# Patient Record
Sex: Male | Born: 1980 | Race: Black or African American | Hispanic: No | Marital: Single | Smoking: Current every day smoker
Health system: Southern US, Community
[De-identification: ages and names within clinical notes are randomized; demographics above are authoritative.]

## PROBLEM LIST (undated history)

## (undated) DIAGNOSIS — S31119A Laceration without foreign body of abdominal wall, unspecified quadrant without penetration into peritoneal cavity, initial encounter: Secondary | ICD-10-CM

---
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
## 2002-10-14 ENCOUNTER — Emergency Department (HOSPITAL_COMMUNITY): Admission: EM | Admit: 2002-10-14 | Discharge: 2002-10-14 | Payer: Self-pay | Admitting: Unknown Physician Specialty

## 2005-09-05 ENCOUNTER — Emergency Department (HOSPITAL_COMMUNITY): Admission: EM | Admit: 2005-09-05 | Discharge: 2005-09-05 | Payer: Self-pay | Admitting: Emergency Medicine

## 2005-09-10 ENCOUNTER — Emergency Department (HOSPITAL_COMMUNITY): Admission: EM | Admit: 2005-09-10 | Discharge: 2005-09-10 | Payer: Self-pay | Admitting: Family Medicine

## 2009-03-12 ENCOUNTER — Emergency Department (HOSPITAL_COMMUNITY): Admission: EM | Admit: 2009-03-12 | Discharge: 2009-03-12 | Payer: Self-pay | Admitting: Emergency Medicine

## 2009-04-04 ENCOUNTER — Emergency Department (HOSPITAL_COMMUNITY): Admission: EM | Admit: 2009-04-04 | Discharge: 2009-04-04 | Payer: Self-pay | Admitting: Emergency Medicine

## 2010-04-23 LAB — GC/CHLAMYDIA PROBE AMP, GENITAL
Chlamydia, DNA Probe: NEGATIVE
GC Probe Amp, Genital: NEGATIVE

## 2010-05-31 ENCOUNTER — Ambulatory Visit: Payer: Self-pay | Admitting: Family Medicine

## 2010-06-15 NOTE — Discharge Summary (Signed)
[place]. [facility]  Patient:    Luis Luna, Luis Luna Visit Number: [id number] MRN: [id number]          Service Type: MED Location: PEDS 6153 [id number] Attending Physician:  Rosanne Sack Dictated by:   Anastasio Auerbach, M.D. Admit Date:  09/25/2000 Discharge Date: 09/28/2000   CC:         Jamesetta Geralds, M.D.   Discharge Summary  DATE OF BIRTH:  [birth year]  DISCHARGE DIAGNOSES: 1. Severe exudative pharyngitis.    a. Trismus.    b. Intractable throat pain.    c. Fever. 2. Dehydration. 3. Right temporomandibular joint pain.  DISCHARGE MEDICATIONS: 1. (New) Azithromycin 250 mg 1 p.o. q.d. at 7 p.m. through September 30, 2000    (total five-day therapy). 2. (New) Clindamycin 300 mg 2 p.o. q.8h. through October 05, 2000 (total    10-day therapy). 3. (New) Ibuprofen 400 mg p.o. t.i.d. with meals for four days. 4. (New) Extra-strength Tylenol q.4h. p.r.n. for breakthrough pain.  * The patient is to take only the above medications.  CONDITION ON DISCHARGE:  Stable.  Afebrile x 36 hours.  No throat pain.  DISPOSITION:  Home with family.  RECOMMENDED ACTIVITY:  No strenuous activity until follow-up with Dr. Tery Sanfilippo.  RECOMMENDED DIET:  As tolerated.  SPECIAL INSTRUCTIONS:  The patient was told to contact Dr. Tery Sanfilippo or go to the walk-in clinic or the emergency room if his sore throat started to get worse or if he developed fevers or significant diarrhea.  FOLLOW-UP:  The patient is to contact Dr. Tery Sanfilippo at [facility] to schedule a follow-up appointment next week.  He should check his throat at that time and also recheck the right TMJ discomfort.  CONSULTATIONS:  None.  PROCEDURES:  None.  HOSPITAL COURSE: #1 - SEVERE EXUDATIVE PHARYNGITIS:  Luis Luna is an 30 year old African-American gentleman who presented with a five-day history of a progressive sore throat followed by fevers, chills, and difficulty swallowing. He was seen in the  emergency department one day prior to actually being admitted to the hospital.  At that time he had a negative Monospot, negative rapid strep, and a negative HIV.  They thought he had thrush and placed him on Diflucan and nystatin.  Overnight his symptoms got worse, and he presented again.  We admitted him to the hospital and started him on IV azithromycin and clindamycin.  His t-max went up to 101.2.  He proceed to defervesce and 36 hours prior to discharge was afebrile.  I opted to keep him on azithromycin for a total of five days of therapy and clindamycin for a total of 10 days of therapy.  At this point his pharynx no longer has exudates on it.  It is mildly erythematous but no longer painful at all.  He knows to return if his sore throat or fevers recur.  Because clindamycin can cause C. difficile colitis, I also told him to call or return if he develops significant diarrhea.  #2 - RIGHT TEMPOROMANDIBULAR JOINT PAIN:  Luis Luna does not have any significant adenopathy.  His uvula is midline.  I have no reason to suspect any type of abscess or extension of infection.  I do not palpate any clicking when he opens and closes his mouth, but that is when the pain occurs.  I will treat this conservatively with ibuprofen and have him follow up with Dr. Tery Sanfilippo in the clinic.  #3 - DEHYDRATION:  Secondary to the acute  infection as well as the inability to eat or drink anything, Luis Luna was mildly dehydrated on presentation. He received IV fluids while he was here.  DISCHARGE LABORATORY DATA:  Hemoglobin 13.2, WBC 7400 (down from 15,600). Blood cultures done ______ 29, 2002, remain negative at the time of discharge.  Rapid strep probe negative. Dictated by:   Anastasio Auerbach, M.D. Attending Physician:  Rosanne Sack DD:  09/28/00 TD:  09/28/00 Job: [id number]

## 2010-06-15 NOTE — H&P (Signed)
[place]. [facility]  Patient:    Luis Luna, Luis Luna Visit Number: [id number] MRN: [id number]          Service Type: EMS Location: [place] Attending Physician:  Ilene Qua Dictated by:   Rosanne Sack, M.D. Admit Date:  09/24/2000 Discharge Date: 09/24/2000   CC:         [facility]   History and Physical  DATE OF BIRTH: 1980-12-30  PROBLEM LIST:  1. Severe exudative pharyngitis with septicemia and trismus.  2. Intractable throat pain secondary to #1.  3. Dehydration.  CHIEF COMPLAINT: Fever, chills, intractable throat pain, unable to swallow.  HISTORY OF PRESENT ILLNESS: The patient is a very pleasant 30 year old African-American man, who presents with a five day history of progressive worsening of sore throat followed by fever, chills, and unable to tolerate his own secretions.  The patient was seen in the emergency department of [place] on September 24, 2000 and at that time he had a fever of 101.0 degrees with a leukocytosis of 13,000 and left shift.  The patient had a negative mono spot, a rapid strep throat, and a negative HIV ______.  It was thought that the patient had a Candida tonsillitis and esophagitis.  He was placed on Diflucan and Nystatin.  Overnight the patients symptoms have gotten worse.  He is not able to swallow liquids without experiencing excruciating pain.  For this reason he has been afraid of swallowing his own secretions. He denies shortness of breath.  He has had some productive cough in the last 24 hours that seems to be secretions from his throat and nasal cavity.  No hemoptysis, hematemesis, melena, tarry stools, bright red blood per rectum. No focal weakness, no syncope.  No urinary symptoms.  No abdominal symptoms. No nausea or vomiting.  No diarrhea or constipation.  The patient describes malaise.  No myalgias.  No arthralgias.  No skin rash.  No headache.   No blurred vision.  No orthopnea or PND.  No rhinorrhea.  The patient complains of intractable constant sore throat that gets worse with swallowing of his own secretions.  PAST MEDICAL HISTORY: As in HPI.  ALLERGIES: None.  MEDICATIONS:  1. Diflucan 100 mg p.o. q.d.  2. Nystatin q.i.d.  3. OxyContin.  The patient was not able to swallow the Diflucan nor the OxyContin due to intractable throat pain.  SOCIAL HISTORY: The patient is single, no children.  No history of IV drug abuse.  He is a International aid/development worker.  No tobacco use.  Occasional alcohol use. Occasional marijuana use.  The patient had unprotected sex with three different partners in the last few months.  FAMILY HISTORY: The patients grandmothers both had diabetes as well as hypertension.  One of the grandmothers had a stroke in her 45s.  The patients great-grandfather may have a heart attack but the patient is not sure.  There is no malignancy history in the family.  REVIEW OF SYSTEMS: As in HPI.  PHYSICAL EXAMINATION:  VITAL SIGNS: Temperature 100.9 degrees, blood pressure 134/60, heart rate 90, respiratory rate 18.  Oxygen saturation 98% on room air.  HEENT: Normocephalic, atraumatic.  Sclerae nonicteric.  Conjunctivae within normal limits.  PERRLA.  EOMI.  Funduscopic examination negative for papilledema or hemorrhages.  TMs within normal limits.  Dry mucous membranes. Oropharynx is consistent with pharyngeal erythema with some exudative plaque. There is no adenopathy.  There is no evidence of stridor.  NECK:  Supple.  No JVD, no bruits, no adenopathy, no thyromegaly.  LUNGS: Clear to auscultation bilaterally.  No crackles, no wheezing.  Good air movement bilaterally.  CARDIAC: Regular rate and rhythm without murmurs, rubs, or gallops.  Normal S1 and S2.  ABDOMEN: Nontender, nondistended.  Bowel sounds present.  No hepatosplenomegaly.  No rebound, guarding, masses, bruits, or guarding.  GU: Examination  within normal limits.  RECTAL: Empty vault, normal sphincter tone.  EXTREMITIES: No edema, no cyanosis.  Pulses 2+ bilaterally.  NEUROLOGIC: Alert and oriented x 3.  Strength 5/5 in all extremities.  DTRs 3/5 in all extremities.  Cranial nerves 2-12 intact.  Sensory intact.  Plantar reflexes downgoing bilaterally.  LABORATORY DATA: Pending.  ASSESSMENT/PLAN:  1. Severe exudative pharyngitis with septicemia and trismus.  As     described in the History of Present Illness section, the patient presents     with worsening symptoms after being treated for 24 hours for possible     Candida tonsillitis.  Physical examination is consistent with severe     exudative pharyngitis without evidence of abscess by clinical and physical     examination.  There is no evidence of stridor.  The patient is able to     swallow though he experiences excruciating pain.  There is no evidence of     human immunodeficiency virus or Candida infection.  The mono spot was     negative as well as the group A strep screening test.  The patient     continues to be febrile and has slight signs of septicemia.  For this     reason our plan is the following - We will obtain two blood cultures,     repeat a CBC and obtain a chest x-ray for completion.  Will start     intravenous antibiotic therapy.  The microorganisms will be covered for     the following - C. diphtheria, Mycoplasma, A. haemolyticus and anaerobic     flora, though the latter is less likely.  For this reason I chose     intravenous Zithromax and clindamycin.  Will support with IV fluids and     viscous lidocaine to help with the patients sore throat.  If the     patients clinical symptoms do not get better by tomorrow morning a CAT     scan of the neck will be obtained and ENT consultation also will be     considered.  2. Intractable throat pain.  As described in problem #1, viscous lidocaine     will be started.  In addition, intravenous morphine will  be used as      needed.  The patient is not able to tolerate tablets.  3. Dehydration.  The patient has clear signs of dehydration.  He has not     been eating well for the last five days.  Also, the patients recurrent     fever has contributed to the patients dehydration.  Will start IV fluids     and follow fluid balance as well as daily weights. Dictated by:   Rosanne Sack, M.D. Attending Physician:  Ilene Qua DD:  09/25/00 TD:  09/26/00 Job: [id number]

## 2010-06-15 NOTE — Discharge Summary (Signed)
[place]. [facility]  Patient:    Luis Luna, Luis Luna Visit Number: [id number] MRN: [id number]          Service Type: MED Location: PEDS 6153 01 Attending Physician:  Rosanne Sack Dictated by:   Anastasio Auerbach, M.D. Admit Date:  09/25/2000 Discharge Date: 09/28/2000   CC:         Jamesetta Geralds, M.D.   Discharge Summary  CONTINUATION  DATE OF BIRTH:  14-Jun-1980  HOSPITAL COURSE: #1 - SEVERE EXUDATIVE PHARYNGITIS:  Luis Luna is an 30 year old African-American gentleman who presented with a five-day history of a progressive sore throat followed by fevers, chills, and difficulty swallowing. He was seen in the emergency department one day prior to actually being admitted to the hospital.  At that time he had a negative Monospot, negative rapid strep, and a negative HIV.  They thought he had thrush and placed him on Diflucan and nystatin.  Overnight his symptoms got worse, and he presented again.  We admitted him to the hospital and started him on IV azithromycin and clindamycin.  His t-max went up to 101.2.  He proceed to defervesce and 36 hours prior to discharge was afebrile.  I opted to keep him on azithromycin for a total of five days of therapy and clindamycin for a total of 10 days of therapy.  At this point his pharynx no longer has exudates on it.  It is mildly erythematous but no longer painful at all.  He knows to return if his sore throat or fevers recur.  Because clindamycin can cause C. difficile colitis, I also told him to call or return if he develops significant diarrhea.  #2 - RIGHT TEMPOROMANDIBULAR JOINT PAIN:  Luis Luna does not have any significant adenopathy.  His uvula is midline.  I have no reason to suspect any type of abscess or extension of infection.  I do not palpate any clicking when he opens and closes his mouth, but that is when the pain occurs.  I will treat this conservatively with ibuprofen and have him follow up  with Dr. Tery Sanfilippo in the clinic.  #3 - DEHYDRATION:  Secondary to the acute infection as well as the inability to eat or drink anything, Luis Luna was mildly dehydrated on presentation. He received IV fluids while he was here.  DISCHARGE LABORATORY DATA:  Hemoglobin 13.2, WBC 7400 (down from 15,600). Blood cultures done ______ 29, 2002, remain negative at the time of discharge.  Rapid strep probe negative. Dictated by:   Anastasio Auerbach, M.D. Attending Physician:  Rosanne Sack DD:  09/28/00 TD:  09/28/00 Job: [id number]

## 2010-06-18 ENCOUNTER — Ambulatory Visit: Payer: Self-pay | Admitting: Family Medicine

## 2010-09-11 ENCOUNTER — Emergency Department (HOSPITAL_COMMUNITY): Payer: Self-pay

## 2010-09-11 ENCOUNTER — Emergency Department (HOSPITAL_COMMUNITY)
Admission: EM | Admit: 2010-09-11 | Discharge: 2010-09-11 | Disposition: A | Discharge status: Home / Self Care | Payer: Self-pay | Attending: Emergency Medicine | Admitting: Emergency Medicine

## 2010-09-11 DIAGNOSIS — M79609 Pain in unspecified limb: Secondary | ICD-10-CM | POA: Insufficient documentation

## 2010-09-11 DIAGNOSIS — S92919A Unspecified fracture of unspecified toe(s), initial encounter for closed fracture: Secondary | ICD-10-CM | POA: Insufficient documentation

## 2010-09-11 DIAGNOSIS — W208XXA Other cause of strike by thrown, projected or falling object, initial encounter: Secondary | ICD-10-CM | POA: Insufficient documentation

## 2010-12-18 ENCOUNTER — Emergency Department (HOSPITAL_COMMUNITY): Payer: Self-pay

## 2010-12-18 ENCOUNTER — Encounter: Payer: Self-pay | Admitting: *Deleted

## 2010-12-18 ENCOUNTER — Encounter (HOSPITAL_COMMUNITY): Payer: Self-pay

## 2010-12-18 ENCOUNTER — Encounter (HOSPITAL_COMMUNITY)
Admission: EM | Disposition: A | Discharge status: Other Acute Inpatient Hospital | Payer: Self-pay | Source: Home / Self Care

## 2010-12-18 ENCOUNTER — Inpatient Hospital Stay (HOSPITAL_COMMUNITY)
Admission: EM | Admit: 2010-12-18 | Discharge: 2010-12-25 | DRG: 982 | Payer: Self-pay | Attending: General Surgery | Admitting: General Surgery

## 2010-12-18 DIAGNOSIS — S41109A Unspecified open wound of unspecified upper arm, initial encounter: Secondary | ICD-10-CM

## 2010-12-18 DIAGNOSIS — S36119A Unspecified injury of liver, initial encounter: Secondary | ICD-10-CM

## 2010-12-18 DIAGNOSIS — S31119A Laceration without foreign body of abdominal wall, unspecified quadrant without penetration into peritoneal cavity, initial encounter: Secondary | ICD-10-CM | POA: Diagnosis present

## 2010-12-18 DIAGNOSIS — J982 Interstitial emphysema: Secondary | ICD-10-CM | POA: Diagnosis present

## 2010-12-18 DIAGNOSIS — T148XXA Other injury of unspecified body region, initial encounter: Secondary | ICD-10-CM

## 2010-12-18 DIAGNOSIS — D62 Acute posthemorrhagic anemia: Secondary | ICD-10-CM | POA: Diagnosis present

## 2010-12-18 DIAGNOSIS — K560 Paralytic ileus: Secondary | ICD-10-CM | POA: Diagnosis present

## 2010-12-18 DIAGNOSIS — IMO0002 Reserved for concepts with insufficient information to code with codable children: Secondary | ICD-10-CM | POA: Diagnosis present

## 2010-12-18 DIAGNOSIS — K668 Other specified disorders of peritoneum: Secondary | ICD-10-CM | POA: Diagnosis present

## 2010-12-18 DIAGNOSIS — S21109A Unspecified open wound of unspecified front wall of thorax without penetration into thoracic cavity, initial encounter: Secondary | ICD-10-CM

## 2010-12-18 DIAGNOSIS — S31109A Unspecified open wound of abdominal wall, unspecified quadrant without penetration into peritoneal cavity, initial encounter: Principal | ICD-10-CM | POA: Diagnosis present

## 2010-12-18 DIAGNOSIS — S25509A Unspecified injury of intercostal blood vessels, unspecified side, initial encounter: Secondary | ICD-10-CM | POA: Diagnosis present

## 2010-12-18 DIAGNOSIS — S51809A Unspecified open wound of unspecified forearm, initial encounter: Secondary | ICD-10-CM | POA: Diagnosis present

## 2010-12-18 DIAGNOSIS — S36116A Major laceration of liver, initial encounter: Secondary | ICD-10-CM | POA: Diagnosis present

## 2010-12-18 DIAGNOSIS — Z5900 Homelessness unspecified: Secondary | ICD-10-CM

## 2010-12-18 DIAGNOSIS — S20219A Contusion of unspecified front wall of thorax, initial encounter: Secondary | ICD-10-CM | POA: Diagnosis present

## 2010-12-18 DIAGNOSIS — Y998 Other external cause status: Secondary | ICD-10-CM

## 2010-12-18 DIAGNOSIS — F329 Major depressive disorder, single episode, unspecified: Secondary | ICD-10-CM | POA: Diagnosis present

## 2010-12-18 DIAGNOSIS — S90412A Abrasion, left great toe, initial encounter: Secondary | ICD-10-CM | POA: Diagnosis present

## 2010-12-18 DIAGNOSIS — M109 Gout, unspecified: Secondary | ICD-10-CM | POA: Diagnosis present

## 2010-12-18 DIAGNOSIS — S36113A Laceration of liver, unspecified degree, initial encounter: Secondary | ICD-10-CM | POA: Diagnosis present

## 2010-12-18 DIAGNOSIS — F3289 Other specified depressive episodes: Secondary | ICD-10-CM | POA: Diagnosis present

## 2010-12-18 DIAGNOSIS — S41112A Laceration without foreign body of left upper arm, initial encounter: Secondary | ICD-10-CM | POA: Diagnosis present

## 2010-12-18 HISTORY — PX: LAPAROTOMY: SHX154

## 2010-12-18 LAB — COMPREHENSIVE METABOLIC PANEL
AST: 26 U/L (ref 0–37)
Albumin: 4.5 g/dL (ref 3.5–5.2)
Calcium: 10.3 mg/dL (ref 8.4–10.5)
Creatinine, Ser: 1.37 mg/dL — ABNORMAL HIGH (ref 0.50–1.35)
Sodium: 138 mEq/L (ref 135–145)

## 2010-12-18 LAB — LACTIC ACID, PLASMA: Lactic Acid, Venous: 10.2 mmol/L — ABNORMAL HIGH (ref 0.5–2.2)

## 2010-12-18 LAB — POCT I-STAT, CHEM 8
BUN: 15 mg/dL (ref 6–23)
Chloride: 102 mEq/L (ref 96–112)
Creatinine, Ser: 1.4 mg/dL — ABNORMAL HIGH (ref 0.50–1.35)
Hemoglobin: 17.3 g/dL — ABNORMAL HIGH (ref 13.0–17.0)
Potassium: 3.6 mEq/L (ref 3.5–5.1)
Sodium: 139 mEq/L (ref 135–145)

## 2010-12-18 LAB — CBC
HCT: 46.7 % (ref 39.0–52.0)
Hemoglobin: 15.9 g/dL (ref 13.0–17.0)
MCH: 30.5 pg (ref 26.0–34.0)
MCV: 89.6 fL (ref 78.0–100.0)
RBC: 5.21 MIL/uL (ref 4.22–5.81)

## 2010-12-18 SURGERY — LAPAROTOMY, EXPLORATORY
Anesthesia: General | Wound class: Dirty or Infected

## 2010-12-18 MED ORDER — DIPHENHYDRAMINE HCL 50 MG/ML IJ SOLN
12.5000 mg | Freq: Four times a day (QID) | INTRAMUSCULAR | Status: DC | PRN
  Administered 2010-12-19 (×2): 12.5 mg via INTRAVENOUS
  Filled 2010-12-18 (×2): qty 1

## 2010-12-18 MED ORDER — ONDANSETRON HCL 4 MG/2ML IJ SOLN
INTRAMUSCULAR | Status: DC | PRN
  Administered 2010-12-18: 4 mg via INTRAVENOUS

## 2010-12-18 MED ORDER — CEFAZOLIN SODIUM 1-5 GM-% IV SOLN
INTRAVENOUS | Status: AC
  Administered 2010-12-18: 1 g via INTRAVENOUS
  Filled 2010-12-18: qty 50

## 2010-12-18 MED ORDER — CEFAZOLIN SODIUM 1 G IJ SOLR
INTRAMUSCULAR | Status: AC
  Administered 2010-12-18: 1 g via INTRAMUSCULAR
  Administered 2010-12-18: 20:00:00
  Filled 2010-12-18: qty 10

## 2010-12-18 MED ORDER — FENTANYL CITRATE 0.05 MG/ML IJ SOLN
INTRAMUSCULAR | Status: DC | PRN
  Administered 2010-12-18: 50 ug via INTRAVENOUS
  Administered 2010-12-18: 150 ug via INTRAVENOUS
  Administered 2010-12-18: 100 ug via INTRAVENOUS
  Administered 2010-12-18: 50 ug via INTRAVENOUS
  Administered 2010-12-18: 100 ug via INTRAVENOUS

## 2010-12-18 MED ORDER — FENTANYL CITRATE 0.05 MG/ML IJ SOLN
INTRAMUSCULAR | Status: DC | PRN
  Administered 2010-12-18: 20:00:00 via INTRAVENOUS
  Administered 2010-12-18: 50 ug via INTRAVENOUS
  Administered 2010-12-18: 20:00:00 via INTRAVENOUS

## 2010-12-18 MED ORDER — HYDRALAZINE HCL 20 MG/ML IJ SOLN
INTRAMUSCULAR | Status: DC | PRN
  Administered 2010-12-18: 5 mg via INTRAVENOUS

## 2010-12-18 MED ORDER — NEOSTIGMINE METHYLSULFATE 1 MG/ML IJ SOLN
INTRAMUSCULAR | Status: DC | PRN
  Administered 2010-12-18: 3.5 mg via INTRAVENOUS

## 2010-12-18 MED ORDER — DIPHENHYDRAMINE HCL 12.5 MG/5ML PO ELIX
12.5000 mg | ORAL_SOLUTION | Freq: Four times a day (QID) | ORAL | Status: DC | PRN
  Administered 2010-12-20: 12.5 mg via ORAL
  Filled 2010-12-18 (×3): qty 5

## 2010-12-18 MED ORDER — ONDANSETRON HCL 4 MG/2ML IJ SOLN: 4.0000 mg | Freq: Once | INTRAMUSCULAR | Status: DC | PRN

## 2010-12-18 MED ORDER — LACTATED RINGERS IV SOLN: INTRAVENOUS | Status: DC

## 2010-12-18 MED ORDER — HETASTARCH-ELECTROLYTES 6 % IV SOLN
INTRAVENOUS | Status: DC | PRN
  Administered 2010-12-18: 21:00:00 via INTRAVENOUS

## 2010-12-18 MED ORDER — PROPOFOL 10 MG/ML IV EMUL
INTRAVENOUS | Status: DC | PRN
  Administered 2010-12-18: 50 mg via INTRAVENOUS
  Administered 2010-12-18: 370 mg via INTRAVENOUS

## 2010-12-18 MED ORDER — HYDROMORPHONE HCL PF 1 MG/ML IJ SOLN
0.2500 mg | INTRAMUSCULAR | Status: DC | PRN
  Administered 2010-12-18 (×2): 0.5 mg via INTRAVENOUS

## 2010-12-18 MED ORDER — ONDANSETRON HCL 4 MG/2ML IJ SOLN
4.0000 mg | Freq: Four times a day (QID) | INTRAMUSCULAR | Status: DC | PRN
  Administered 2010-12-19: 4 mg via INTRAVENOUS
  Filled 2010-12-18: qty 2

## 2010-12-18 MED ORDER — SUCCINYLCHOLINE CHLORIDE 20 MG/ML IJ SOLN
INTRAMUSCULAR | Status: DC | PRN
  Administered 2010-12-18: 100 mg via INTRAVENOUS

## 2010-12-18 MED ORDER — NALOXONE HCL 0.4 MG/ML IJ SOLN: 0.4000 mg | INTRAMUSCULAR | Status: DC | PRN

## 2010-12-18 MED ORDER — MORPHINE SULFATE 2 MG/ML IJ SOLN: 0.0500 mg/kg | INTRAMUSCULAR | Status: DC | PRN

## 2010-12-18 MED ORDER — CEFAZOLIN SODIUM 1-5 GM-% IV SOLN
INTRAVENOUS | Status: AC
  Filled 2010-12-18: qty 100

## 2010-12-18 MED ORDER — IOHEXOL 300 MG/ML  SOLN
100.0000 mL | Freq: Once | INTRAMUSCULAR | Status: AC | PRN
  Administered 2010-12-18: 100 mL via INTRAVENOUS

## 2010-12-18 MED ORDER — MICROFIBRILLAR COLL HEMOSTAT EX PADS
MEDICATED_PAD | CUTANEOUS | Status: DC | PRN
  Administered 2010-12-18: 1 via TOPICAL

## 2010-12-18 MED ORDER — MEPERIDINE HCL 25 MG/ML IJ SOLN: 6.2500 mg | INTRAMUSCULAR | Status: DC | PRN

## 2010-12-18 MED ORDER — MIDAZOLAM HCL 5 MG/5ML IJ SOLN
INTRAMUSCULAR | Status: DC | PRN
  Administered 2010-12-18: 2 mg via INTRAVENOUS

## 2010-12-18 MED ORDER — MORPHINE SULFATE (PF) 1 MG/ML IV SOLN
INTRAVENOUS | Status: DC
  Administered 2010-12-18: 1 mg via INTRAVENOUS
  Administered 2010-12-19: 4.4 mg via INTRAVENOUS
  Administered 2010-12-19: 9 mg via INTRAVENOUS
  Administered 2010-12-19 (×2): via INTRAVENOUS
  Administered 2010-12-19: 18.44 mg via INTRAVENOUS
  Administered 2010-12-19: 22.19 mg via INTRAVENOUS
  Administered 2010-12-19: 10.5 mg via INTRAVENOUS
  Administered 2010-12-20: 12 mg via INTRAVENOUS
  Administered 2010-12-20: 12:00:00 via INTRAVENOUS
  Administered 2010-12-20: 4 mg via INTRAVENOUS
  Administered 2010-12-20: 11 mg via INTRAVENOUS
  Administered 2010-12-21: 3 mg via INTRAVENOUS
  Administered 2010-12-21 (×2): 2 mg via INTRAVENOUS
  Administered 2010-12-21: 6 mg via INTRAVENOUS
  Administered 2010-12-21: 3 mg via INTRAVENOUS
  Administered 2010-12-21: 4 mg via INTRAVENOUS
  Administered 2010-12-21 – 2010-12-22 (×2): 1 mg via INTRAVENOUS
  Administered 2010-12-22: 2 mg via INTRAVENOUS
  Administered 2010-12-22: 5 mg via INTRAVENOUS
  Administered 2010-12-22: 2 mg via INTRAVENOUS
  Administered 2010-12-23 (×2): 1 mg via INTRAVENOUS
  Filled 2010-12-18 (×6): qty 25

## 2010-12-18 MED ORDER — GLYCOPYRROLATE 0.2 MG/ML IJ SOLN
INTRAMUSCULAR | Status: DC | PRN
  Administered 2010-12-18: .5 mg via INTRAVENOUS

## 2010-12-18 MED ORDER — PANTOPRAZOLE SODIUM 40 MG IV SOLR
40.0000 mg | Freq: Every day | INTRAVENOUS | Status: DC
  Administered 2010-12-19: 40 mg via INTRAVENOUS
  Filled 2010-12-18 (×2): qty 40

## 2010-12-18 MED ORDER — ONDANSETRON HCL 4 MG PO TABS: 4.0000 mg | ORAL_TABLET | Freq: Four times a day (QID) | ORAL | Status: DC | PRN

## 2010-12-18 MED ORDER — POTASSIUM CHLORIDE IN NACL 20-0.9 MEQ/L-% IV SOLN
INTRAVENOUS | Status: DC
  Administered 2010-12-19 – 2010-12-24 (×7): via INTRAVENOUS
  Filled 2010-12-18 (×10): qty 1000

## 2010-12-18 MED ORDER — ROCURONIUM BROMIDE 100 MG/10ML IV SOLN
INTRAVENOUS | Status: DC | PRN
  Administered 2010-12-18: 30 mg via INTRAVENOUS
  Administered 2010-12-18: 50 mg via INTRAVENOUS

## 2010-12-18 MED ORDER — LACTATED RINGERS IV SOLN
INTRAVENOUS | Status: DC | PRN
  Administered 2010-12-18: 20:00:00 via INTRAVENOUS

## 2010-12-18 MED ORDER — SODIUM CHLORIDE 0.9 % IJ SOLN: 9.0000 mL | INTRAMUSCULAR | Status: DC | PRN

## 2010-12-18 MED ORDER — PANTOPRAZOLE SODIUM 40 MG PO TBEC
40.0000 mg | DELAYED_RELEASE_TABLET | Freq: Every day | ORAL | Status: DC
  Administered 2010-12-19 – 2010-12-23 (×5): 40 mg via ORAL
  Filled 2010-12-18 (×4): qty 1

## 2010-12-18 MED ORDER — BACITRACIN ZINC 500 UNIT/GM EX OINT
TOPICAL_OINTMENT | CUTANEOUS | Status: DC | PRN
  Administered 2010-12-18: 1 via TOPICAL

## 2010-12-18 SURGICAL SUPPLY — 55 items
BANDAGE ELASTIC 4 VELCRO ST LF (GAUZE/BANDAGES/DRESSINGS) ×1 IMPLANT
BLADE SURG ROTATE 9660 (MISCELLANEOUS) ×1 IMPLANT
CANISTER SUCTION 2500CC (MISCELLANEOUS) ×2 IMPLANT
CHLORAPREP W/TINT 26ML (MISCELLANEOUS) ×2 IMPLANT
CLOTH BEACON ORANGE TIMEOUT ST (SAFETY) ×2 IMPLANT
COVER SURGICAL LIGHT HANDLE (MISCELLANEOUS) ×2 IMPLANT
DRAPE LAPAROSCOPIC ABDOMINAL (DRAPES) ×2 IMPLANT
DRAPE UTILITY 15X26 W/TAPE STR (DRAPE) ×4 IMPLANT
DRAPE WARM FLUID 44X44 (DRAPE) ×2 IMPLANT
DRSG PAD ABDOMINAL 8X10 ST (GAUZE/BANDAGES/DRESSINGS) ×1 IMPLANT
ELECT BLADE 6.5 EXT (BLADE) IMPLANT
ELECT REM PT RETURN 9FT ADLT (ELECTROSURGICAL) ×2
ELECTRODE REM PT RTRN 9FT ADLT (ELECTROSURGICAL) ×1 IMPLANT
GAUZE XEROFORM 1X8 LF (GAUZE/BANDAGES/DRESSINGS) ×2 IMPLANT
GLOVE BIO SURGEON STRL SZ7.5 (GLOVE) ×1 IMPLANT
GLOVE BIO SURGEON STRL SZ8 (GLOVE) ×1 IMPLANT
GLOVE BIOGEL PI IND STRL 7.5 (GLOVE) IMPLANT
GLOVE BIOGEL PI IND STRL 8 (GLOVE) ×1 IMPLANT
GLOVE BIOGEL PI INDICATOR 7.5 (GLOVE) ×1
GLOVE BIOGEL PI INDICATOR 8 (GLOVE) ×1
GLOVE ECLIPSE 7.5 STRL STRAW (GLOVE) ×2 IMPLANT
GOWN STRL NON-REIN LRG LVL3 (GOWN DISPOSABLE) ×4 IMPLANT
GOWN STRL REIN XL XLG (GOWN DISPOSABLE) ×2 IMPLANT
HEMOSTAT SURGICEL .5X2 ABSORB (HEMOSTASIS) ×1 IMPLANT
HEMOSTAT SURGICEL 2X14 (HEMOSTASIS) ×1 IMPLANT
HEMOSTAT SURGICEL 2X4 FIBR (HEMOSTASIS) ×1 IMPLANT
HIBICLENS 32OZ XXX (MISCELLANEOUS) ×1 IMPLANT
KIT BASIN OR (CUSTOM PROCEDURE TRAY) ×2 IMPLANT
KIT ROOM TURNOVER OR (KITS) ×2 IMPLANT
LIGASURE IMPACT 36 18CM CVD LR (INSTRUMENTS) IMPLANT
NS IRRIG 1000ML POUR BTL (IV SOLUTION) ×7 IMPLANT
PACK GENERAL/GYN (CUSTOM PROCEDURE TRAY) ×2 IMPLANT
PAD ARMBOARD 7.5X6 YLW CONV (MISCELLANEOUS) ×2 IMPLANT
SPECIMEN JAR X LARGE (MISCELLANEOUS) IMPLANT
SPONGE GAUZE 4X4 12PLY (GAUZE/BANDAGES/DRESSINGS) ×3 IMPLANT
SPONGE LAP 18X18 X RAY DECT (DISPOSABLE) ×2 IMPLANT
STAPLER VISISTAT 35W (STAPLE) ×2 IMPLANT
SUCTION POOLE TIP (SUCTIONS) ×1 IMPLANT
SUT CHROMIC 0 BP (SUTURE) ×1 IMPLANT
SUT PDS AB 1 TP1 96 (SUTURE) ×4 IMPLANT
SUT SILK 2 0 (SUTURE) ×2
SUT SILK 2 0 SH CR/8 (SUTURE) ×2 IMPLANT
SUT SILK 2-0 18XBRD TIE 12 (SUTURE) ×1 IMPLANT
SUT SILK 3 0 (SUTURE) ×2
SUT SILK 3 0 SH CR/8 (SUTURE) ×2 IMPLANT
SUT SILK 3-0 18XBRD TIE 12 (SUTURE) ×1 IMPLANT
SUT VIC AB 0 CT1 27 (SUTURE) ×10
SUT VIC AB 0 CT1 27XBRD ANBCTR (SUTURE) IMPLANT
SUT VIC AB 3-0 SH 18 (SUTURE) ×1 IMPLANT
TAPE CLOTH SURG 4X10 WHT LF (GAUZE/BANDAGES/DRESSINGS) ×1 IMPLANT
TOWEL OR 17X24 6PK STRL BLUE (TOWEL DISPOSABLE) ×2 IMPLANT
TOWEL OR 17X26 10 PK STRL BLUE (TOWEL DISPOSABLE) ×2 IMPLANT
TRAY FOLEY CATH 14FRSI W/METER (CATHETERS) IMPLANT
WATER STERILE IRR 1000ML POUR (IV SOLUTION) ×2 IMPLANT
YANKAUER SUCT BULB TIP NO VENT (SUCTIONS) IMPLANT

## 2010-12-18 NOTE — ED Notes (Signed)
To th or 15 minutes ago

## 2010-12-18 NOTE — Transfer of Care (Signed)
Immediate Anesthesia Transfer of Care Note  Patient: Luis Luna  Procedure(s) Performed:  EXPLORATORY LAPAROTOMY  Patient Location: PACU  Anesthesia Type: General  Level of Consciousness: sedated and patient cooperative  Airway & Oxygen Therapy: Patient Spontanous Breathing and Patient connected to face mask oxygen  Post-op Assessment: Report given to PACU RN, Post -op Vital signs reviewed and stable, Patient moving all extremities and Patient moving all extremities X 4  Post vital signs: Reviewed and stable  Complications: No apparent anesthesia complications
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
## 2010-12-18 NOTE — Anesthesia Preprocedure Evaluation (Addendum)
Anesthesia Evaluation  Patient identified by MRN, date of birth, ID band Patient awake    Reviewed: Allergy & Precautions, H&P , NPO status , Patient's Chart, lab work & pertinent test results  History of Anesthesia Complications Negative for: history of anesthetic complications  Airway Mallampati: II TM Distance: <3 FB Neck ROM: full    Dental  (+) Teeth Intact   Pulmonary  + marijuana and cigarettes clear to auscultation        Cardiovascular regular Normal    Neuro/Psych    GI/Hepatic   Endo/Other    Renal/GU      Musculoskeletal   Abdominal   Peds  Hematology   Anesthesia Other Findings   Reproductive/Obstetrics                           Anesthesia Physical Anesthesia Plan  ASA: II and Emergent  Anesthesia Plan: General   Post-op Pain Management:    Induction:   Airway Management Planned:   Additional Equipment:   Intra-op Plan:   Post-operative Plan:   Informed Consent: I have reviewed the patients History and Physical, chart, labs and discussed the procedure including the risks, benefits and alternatives for the proposed anesthesia with the patient or authorized representative who has indicated his/her understanding and acceptance.   Dental Advisory Given  Plan Discussed with: Anesthesiologist, CRNA and Surgeon  Anesthesia Plan Comments:        Anesthesia Quick Evaluation
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
## 2010-12-18 NOTE — ED Notes (Signed)
Bedside fast being completed

## 2010-12-18 NOTE — ED Notes (Signed)
Shorts cut off one earring removed from his rt ear.  He says he has a lt earring but there is not one visible

## 2010-12-18 NOTE — Anesthesia Procedure Notes (Addendum)
Procedure Name: Intubation Date/Time: 12/18/2010 8:18 PM Performed by: Fuller Canada Pre-anesthesia Checklist: Patient identified, Emergency Drugs available, Suction available, Patient being monitored and Timeout performed Patient Re-evaluated:Patient Re-evaluated prior to inductionOxygen Delivery Method: Circle System Utilized Preoxygenation: Pre-oxygenation with 100% oxygen Intubation Type: IV induction, Circoid Pressure applied and Rapid sequence Laryngoscope Size: Mac and 4 Grade View: Grade I Tube type: Oral Tube size: 8.0 mm Number of attempts: 1 Airway Equipment and Method: stylet Placement Confirmation: ETT inserted through vocal cords under direct vision,  breath sounds checked- equal and bilateral and positive ETCO2 Secured at: 24 cm Tube secured with: Tape Dental Injury: Teeth and Oropharynx as per pre-operative assessment    Performed by: Azana Kiesler A

## 2010-12-18 NOTE — Anesthesia Postprocedure Evaluation (Signed)
  Anesthesia Post-op Note  Patient: Luis Luna  Procedure(s) Performed:  EXPLORATORY LAPAROTOMY  Patient Location: PACU  Anesthesia Type: General  Level of Consciousness: awake, alert  and oriented  Airway and Oxygen Therapy: Patient Spontanous Breathing and Patient connected to nasal cannula oxygen  Post-op Pain: mild  Post-op Assessment: Post-op Vital signs reviewed, Respiratory Function Stable, Patent Airway, No signs of Nausea or vomiting and Pain level controlled  Post-op Vital Signs: Reviewed and stable  Complications: No apparent anesthesia complications
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
## 2010-12-19 ENCOUNTER — Encounter (HOSPITAL_COMMUNITY): Payer: Self-pay | Admitting: *Deleted

## 2010-12-19 ENCOUNTER — Inpatient Hospital Stay (HOSPITAL_COMMUNITY): Payer: Self-pay

## 2010-12-19 LAB — BASIC METABOLIC PANEL
BUN: 12 mg/dL (ref 6–23)
CO2: 23 mEq/L (ref 19–32)
Chloride: 103 mEq/L (ref 96–112)
Creatinine, Ser: 1.06 mg/dL (ref 0.50–1.35)
Glucose, Bld: 142 mg/dL — ABNORMAL HIGH (ref 70–99)
Potassium: 3.7 mEq/L (ref 3.5–5.1)

## 2010-12-19 LAB — CBC
HCT: 35.2 % — ABNORMAL LOW (ref 39.0–52.0)
Hemoglobin: 11.9 g/dL — ABNORMAL LOW (ref 13.0–17.0)
MCV: 87.8 fL (ref 78.0–100.0)
WBC: 14.8 10*3/uL — ABNORMAL HIGH (ref 4.0–10.5)

## 2010-12-19 LAB — MRSA PCR SCREENING: MRSA by PCR: NEGATIVE

## 2010-12-19 LAB — APTT: aPTT: 30 seconds (ref 24–37)

## 2010-12-19 MED ORDER — OXYCODONE-ACETAMINOPHEN 5-325 MG PO TABS
1.0000 | ORAL_TABLET | ORAL | Status: DC | PRN
  Administered 2010-12-19 – 2010-12-23 (×8): 2 via ORAL
  Filled 2010-12-19 (×8): qty 2

## 2010-12-19 MED ORDER — HYDROMORPHONE HCL PF 1 MG/ML IJ SOLN
INTRAMUSCULAR | Status: AC
  Administered 2010-12-19: 2 mg via INTRAVENOUS
  Filled 2010-12-19: qty 3

## 2010-12-19 MED ORDER — BACITRACIN ZINC 500 UNIT/GM EX OINT
TOPICAL_OINTMENT | Freq: Two times a day (BID) | CUTANEOUS | Status: DC
  Administered 2010-12-19 – 2010-12-24 (×12): via TOPICAL
  Filled 2010-12-19 (×2): qty 15

## 2010-12-19 MED ORDER — MORPHINE SULFATE (PF) 1 MG/ML IV SOLN
INTRAVENOUS | Status: AC
  Administered 2010-12-19
  Filled 2010-12-19: qty 25

## 2010-12-19 MED ORDER — BACITRACIN ZINC 500 UNIT/GM EX OINT
TOPICAL_OINTMENT | CUTANEOUS | Status: DC | PRN
  Filled 2010-12-19: qty 15

## 2010-12-19 MED ORDER — HYDROMORPHONE HCL PF 1 MG/ML IJ SOLN
1.0000 mg | INTRAMUSCULAR | Status: DC | PRN
  Administered 2010-12-19: 2 mg via INTRAVENOUS
  Administered 2010-12-19 (×2): 1 mg via INTRAVENOUS
  Administered 2010-12-19 (×2): 2 mg via INTRAVENOUS
  Administered 2010-12-19: 1 mg via INTRAVENOUS
  Filled 2010-12-19: qty 2
  Filled 2010-12-19: qty 3
  Filled 2010-12-19: qty 2
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
## 2010-12-19 NOTE — ED Provider Notes (Signed)
CRITICAL CARE Performed by: Nelia Shi   Total critical care time: 30  Critical care time was exclusive of separately billable procedures and treating other patients.  Critical care was necessary to treat or prevent imminent or life-threatening deterioration.  Critical care was time spent personally by me on the following activities: development of treatment plan with patient and/or surrogate as well as nursing, discussions with consultants, evaluation of patient's response to treatment, examination of patient, obtaining history from patient or surrogate, ordering and performing treatments and interventions, ordering and review of laboratory studies, ordering and review of radiographic studies, pulse oximetry and re-evaluation of patient's condition.   I saw and evaluated the patient, reviewed the resident's note and I agree with the findings and plan.   .Face to face Exam:  General:  Awake HEENT:  PERL Resp:  Normal effort Abd:  Nondistended Neuro:No focal weakness Lymph: No adenopathy   Nelia Shi, MD 12/19/10 [phone]
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
## 2010-12-19 NOTE — Progress Notes (Signed)
Pt requests that the mirror be covered so he doesn't have to see his face. States he is depressed. Chaplain called.[name]11/21/2012
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
## 2010-12-20 LAB — CBC
HCT: 33.3 % — ABNORMAL LOW (ref 39.0–52.0)
MCHC: 33.6 g/dL (ref 30.0–36.0)
MCV: 89.8 fL (ref 78.0–100.0)
RDW: 12.7 % (ref 11.5–15.5)
WBC: 9.7 10*3/uL (ref 4.0–10.5)

## 2010-12-20 LAB — DIFFERENTIAL
Basophils Absolute: 0 10*3/uL (ref 0.0–0.1)
Eosinophils Relative: 1 % (ref 0–5)
Lymphocytes Relative: 19 % (ref 12–46)
Monocytes Absolute: 1.3 10*3/uL — ABNORMAL HIGH (ref 0.1–1.0)

## 2010-12-20 MED ORDER — MORPHINE SULFATE (PF) 1 MG/ML IV SOLN
INTRAVENOUS | Status: AC
  Filled 2010-12-20: qty 25

## 2010-12-20 NOTE — Progress Notes (Signed)
Chaplain's Note:  Pt was very distraught and visibly crying.  Pt is upset that no family is checking on his well-being.  Chaplain provided emotional and spiritual support.  Chaplain provided pt with art supplies to occupy his hands.  He immediately began using them.  Pt is very fragile emotionally and is not sure where he is going upon d/c.  Chaplain asked nurse to contact CSW. Pt also requested that his mother be contacted to see if he could go there upon d/c.  Chaplain gave mother's name and number to the nurse. Pt requested chaplain's name for further contact if needed. Please page chaplain if needed or requested.  Dellie Catholic  [phone]  12/20/10 1154  Clinical Encounter Type  Visited With Patient  Visit Type Spiritual support;Psychological support  Referral From Nurse  Consult/Referral To Chaplain  Spiritual Encounters  Spiritual Needs Emotional  Stress Factors  Patient Stress Factors Family relationships;Financial concerns;Health changes;Lack of caregivers;Loss of control;Major life changes
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
## 2010-12-21 DIAGNOSIS — S36113A Laceration of liver, unspecified degree, initial encounter: Secondary | ICD-10-CM

## 2010-12-21 LAB — TYPE AND SCREEN
ABO/RH(D): A NEG
Antibody Screen: NEGATIVE
Unit division: 0
Unit division: 0
Unit division: 0
Unit division: 0
Unit division: 0

## 2010-12-21 LAB — DIFFERENTIAL
Eosinophils Absolute: 0.1 10*3/uL (ref 0.0–0.7)
Lymphocytes Relative: 18 % (ref 12–46)
Lymphs Abs: 1.4 10*3/uL (ref 0.7–4.0)
Monocytes Relative: 11 % (ref 3–12)
Neutrophils Relative %: 69 % (ref 43–77)

## 2010-12-21 LAB — CBC
Hemoglobin: 11.3 g/dL — ABNORMAL LOW (ref 13.0–17.0)
MCH: 29.8 pg (ref 26.0–34.0)
Platelets: 131 10*3/uL — ABNORMAL LOW (ref 150–400)
RBC: 3.79 MIL/uL — ABNORMAL LOW (ref 4.22–5.81)
WBC: 7.8 10*3/uL (ref 4.0–10.5)

## 2010-12-21 MED ORDER — MORPHINE SULFATE (PF) 1 MG/ML IV SOLN
INTRAVENOUS | Status: AC
  Filled 2010-12-21: qty 25

## 2010-12-21 MED ORDER — HYDROCODONE-ACETAMINOPHEN 5-325 MG PO TABS: 1.0000 | ORAL_TABLET | Freq: Four times a day (QID) | ORAL | Status: DC | PRN
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
## 2010-12-21 NOTE — Progress Notes (Signed)
Patient screened by Clinical Social Worker on Friday December 21, 2010 for placement needs and psychiatry concerns.  Patient had several family and friends in the room at time of CSW visit.  As a result, patient will receive a full psychosocial assessment within the next business day.  CSW available for support as needed.  [address] [place], [place] [phone]
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
## 2010-12-21 NOTE — Progress Notes (Signed)
+   one time flatus. Hope ileus will resolve by tomorrow.  Agree with above.
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
## 2010-12-22 ENCOUNTER — Inpatient Hospital Stay (HOSPITAL_COMMUNITY): Payer: Self-pay

## 2010-12-22 LAB — DIFFERENTIAL
Eosinophils Absolute: 0.2 10*3/uL (ref 0.0–0.7)
Eosinophils Relative: 2 % (ref 0–5)
Lymphocytes Relative: 24 % (ref 12–46)
Lymphs Abs: 1.8 10*3/uL (ref 0.7–4.0)
Monocytes Absolute: 1 10*3/uL (ref 0.1–1.0)
Monocytes Relative: 13 % — ABNORMAL HIGH (ref 3–12)

## 2010-12-22 LAB — CBC
HCT: 34 % — ABNORMAL LOW (ref 39.0–52.0)
Hemoglobin: 11.7 g/dL — ABNORMAL LOW (ref 13.0–17.0)
MCH: 30.1 pg (ref 26.0–34.0)
MCV: 87.4 fL (ref 78.0–100.0)
Platelets: 162 10*3/uL (ref 150–400)
RBC: 3.89 MIL/uL — ABNORMAL LOW (ref 4.22–5.81)
WBC: 7.8 10*3/uL (ref 4.0–10.5)

## 2010-12-22 LAB — BASIC METABOLIC PANEL
BUN: 10 mg/dL (ref 6–23)
CO2: 30 mEq/L (ref 19–32)
Calcium: 9.5 mg/dL (ref 8.4–10.5)
Creatinine, Ser: 1.03 mg/dL (ref 0.50–1.35)
GFR calc non Af Amer: >90 mL/min (ref >90)
Glucose, Bld: 102 mg/dL — ABNORMAL HIGH (ref 70–99)
Sodium: 138 mEq/L (ref 135–145)

## 2010-12-22 MED ORDER — BACITRACIN-NEOMYCIN-POLYMYXIN 400-5-5000 EX OINT
TOPICAL_OINTMENT | CUTANEOUS | Status: AC
  Filled 2010-12-22: qty 1

## 2010-12-22 NOTE — Progress Notes (Signed)
Pt requesting some type of med for cough. Educated pt on the importance of coughing and deep breathing and getting up, out of bed.  Demonstrated to him how to splint for coughing, had him demonstrate use of incentive spirometer, (he is able to get IS up to 2500).  Dr. Rayburn Ma called and informed.  Cristy Folks Christell Constant, RN
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
## 2010-12-23 LAB — DIFFERENTIAL
Basophils Relative: 0 % (ref 0–1)
Eosinophils Absolute: 0.1 10*3/uL (ref 0.0–0.7)
Eosinophils Relative: 2 % (ref 0–5)
Lymphs Abs: 1.2 10*3/uL (ref 0.7–4.0)
Neutrophils Relative %: 68 % (ref 43–77)

## 2010-12-23 LAB — CBC
MCH: 29.3 pg (ref 26.0–34.0)
MCHC: 33.8 g/dL (ref 30.0–36.0)
MCV: 86.7 fL (ref 78.0–100.0)
Platelets: 188 10*3/uL (ref 150–400)
RBC: 3.92 MIL/uL — ABNORMAL LOW (ref 4.22–5.81)

## 2010-12-23 LAB — URIC ACID: Uric Acid, Serum: 4.7 mg/dL (ref 4.0–7.8)

## 2010-12-23 MED ORDER — COLCHICINE 0.6 MG PO TABS
0.6000 mg | ORAL_TABLET | Freq: Two times a day (BID) | ORAL | Status: DC
  Administered 2010-12-23 – 2010-12-25 (×5): 0.6 mg via ORAL
  Filled 2010-12-23 (×7): qty 1

## 2010-12-23 MED ORDER — BACITRACIN-NEOMYCIN-POLYMYXIN 400-5-5000 EX OINT
TOPICAL_OINTMENT | CUTANEOUS | Status: AC
  Filled 2010-12-23: qty 1

## 2010-12-23 NOTE — Progress Notes (Signed)
Physical Therapy Treatment Patient Details Name: Luis Luna MRN: [id number] DOB: [birth year] Today's Date: 12/23/2010  PT Assessment/Plan  PT - Assessment/Plan Comments on Treatment Session: Pt progressing with PT goals.  Does well with mobility- no physical assistance needed today.  Encouraged ambulating in hallways with Nsing 3-4x's/day.   PT Plan: Discharge plan remains appropriate PT Frequency: Min 4X/week Follow Up Recommendations: Outpatient PT PT Goals  Acute Rehab PT Goals PT Goal: Rolling Supine to Right Side - Progress: Progressing toward goal PT Goal: Supine/Side to Sit - Progress: Progressing toward goal PT Transfer Goal: Sit to Stand/Stand to Sit - Progress: Met PT Goal: Ambulate - Progress: Progressing toward goal  PT Treatment Precautions/Restrictions  Restrictions Weight Bearing Restrictions: No Mobility (including Balance) Bed Mobility Bed Mobility: Yes Rolling Right: With rail;6: Modified independent (Device/Increase time) Rolling Right Details (indicate cue type and reason): Pt keeps HOB elevated  due to he states HOB flat puts to much strain/stretch on abdomen.   Right Sidelying to Sit: With rails;6: Modified independent (Device/Increase time) Right Sidelying to Sit Details (indicate cue type and reason): Pt keeps HOB elevated.   Transfers Sit to Stand: 6: Modified independent (Device/Increase time) Sit to Stand Details (indicate cue type and reason): braces stomach with pillow while standing Stand to Sit: 6: Modified independent (Device/Increase time) Stand to Sit Details: Leans to Right  Ambulation/Gait Ambulation/Gait Assistance: 5: Supervision Ambulation/Gait Assistance Details (indicate cue type and reason): pushes IV pole with one hand & braces stomach with pillow with other hand.  Encouragement to increase upright posture but pt states he's unable to tolerate standing upright due to "pull/stretch" on stomach.   Ambulation Distance (Feet): 300  Feet Assistive device: Other (Comment) (pushed IV pole) Gait Pattern: Step-through pattern;Trunk flexed Stairs: No  Static Standing Balance Static Standing - Balance Support: No upper extremity supported;During functional activity (standing at sink x 3 minutes).  Stands with trunk flexed & leans more towards right side.   Static Standing - Level of Assistance: 7: Independent Exercise    End of Session PT - End of Session Activity Tolerance: Patient tolerated treatment well Patient left: in chair;with call bell in reach General Behavior During Session: [facility] for tasks performed Cognition: [facility] for tasks performed  Lara Mulch 12/23/2010, 9:14 AM [phone]
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
## 2010-12-24 DIAGNOSIS — F329 Major depressive disorder, single episode, unspecified: Secondary | ICD-10-CM

## 2010-12-24 DIAGNOSIS — D62 Acute posthemorrhagic anemia: Secondary | ICD-10-CM

## 2010-12-24 DIAGNOSIS — M109 Gout, unspecified: Secondary | ICD-10-CM | POA: Clinically undetermined

## 2010-12-24 LAB — DIFFERENTIAL
Basophils Absolute: 0 10*3/uL (ref 0.0–0.1)
Eosinophils Absolute: 0.2 10*3/uL (ref 0.0–0.7)
Eosinophils Relative: 3 % (ref 0–5)
Lymphs Abs: 1.4 10*3/uL (ref 0.7–4.0)
Neutro Abs: 3.4 10*3/uL (ref 1.7–7.7)
Neutrophils Relative %: 61 % (ref 43–77)

## 2010-12-24 LAB — CBC
HCT: 34.6 % — ABNORMAL LOW (ref 39.0–52.0)
Hemoglobin: 11.7 g/dL — ABNORMAL LOW (ref 13.0–17.0)
MCHC: 33.8 g/dL (ref 30.0–36.0)
RBC: 3.94 MIL/uL — ABNORMAL LOW (ref 4.22–5.81)

## 2010-12-24 MED ORDER — MORPHINE SULFATE 2 MG/ML IJ SOLN: 1.0000 mg | INTRAMUSCULAR | Status: DC | PRN

## 2010-12-24 MED ORDER — DOCUSATE SODIUM 100 MG PO CAPS
100.0000 mg | ORAL_CAPSULE | Freq: Two times a day (BID) | ORAL | Status: DC
  Administered 2010-12-24 – 2010-12-25 (×3): 100 mg via ORAL
  Filled 2010-12-24 (×3): qty 1

## 2010-12-24 MED ORDER — POLYETHYLENE GLYCOL 3350 17 G PO PACK
17.0000 g | PACK | Freq: Every day | ORAL | Status: DC
  Administered 2010-12-24 – 2010-12-25 (×2): 17 g via ORAL
  Filled 2010-12-24 (×3): qty 1

## 2010-12-24 MED ORDER — HYDROCODONE-ACETAMINOPHEN 10-325 MG PO TABS
0.5000 | ORAL_TABLET | ORAL | Status: DC | PRN
  Administered 2010-12-25 (×2): 2 via ORAL
  Filled 2010-12-24 (×2): qty 2

## 2010-12-24 NOTE — Progress Notes (Signed)
This is a late entry for 12/22/10:  Covering Clinical Social Worker (CSW) completed psychosocial assessment which can be found in pt shadow chart. Pt reports not having a place to go at dc but will follow up with friends and family to see if anyone is willing to take him in if not pt open to a shelter. Theresia Bough, MSW, Theresia Majors [phone]
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
## 2010-12-24 NOTE — Progress Notes (Signed)
Physical Therapy Treatment Patient Details Name: Luis Luna MRN: [id number] DOB: Jan 23, 1981 Today's Date: 12/24/2010  PT Assessment/Plan  PT - Assessment/Plan Comments on Treatment Session: Pt progressing well and PT will decrease frequency. Advised pt to ambulate 3-4x's daily to promote strength and endurance. Pt needs to practice bed mobility with HOB flat and continue to gain more stability with functional mobility.Pt able to ambulate without IV pole and without abdominal bracing today.  Pt appears much improved today.  PT Plan: Discharge plan remains appropriate PT Frequency: Min 3X/week Follow Up Recommendations: Outpatient PT Equipment Recommended: None recommended by PT PT Goals  Acute Rehab PT Goals PT Goal Formulation: With patient Time For Goal Achievement: 2 weeks Pt will Roll Supine to Right Side: Independently PT Goal: Rolling Supine to Right Side - Progress: Progressing toward goal Pt will Roll Supine to Left Side: Independently PT Goal: Rolling Supine to Left Side - Progress: Progressing toward goal Pt will go Supine/Side to Sit: Independently PT Goal: Supine/Side to Sit - Progress: Progressing toward goal Pt will go Sit to Supine/Side: with modified independence PT Goal: Sit to Supine/Side - Progress: Progressing toward goal Pt will Transfer Sit to Stand/Stand to Sit: Independently PT Transfer Goal: Sit to Stand/Stand to Sit - Progress: Progressing toward goal Pt will Ambulate: >150 feet;with modified independence (goal progressed) PT Goal: Ambulate - Progress: Progressing toward goal Pt will Go Up / Down Stairs: 3-5 stairs;with modified independence PT Goal: Up/Down Stairs - Progress: Progressing toward goal  PT Treatment Precautions/Restrictions  Restrictions Weight Bearing Restrictions: No Mobility (including Balance) Bed Mobility Bed Mobility: Yes Rolling Right: With rail;6: Modified independent (Device/Increase time) Right Sidelying to Sit: With  rails;6: Modified independent (Device/Increase time) Right Sidelying to Sit Details (indicate cue type and reason): Pt cued to lower HOB, pt continued with HOB elevated. Transfers Transfers: Yes Sit to Stand: 6: Modified independent (Device/Increase time) Sit to Stand Details (indicate cue type and reason): Pt holds onto IV pole in standing Stand to Sit: 6: Modified independent (Device/Increase time) Stand to Sit Details: Very slow descent to modulate pain Ambulation/Gait Ambulation/Gait: Yes Ambulation/Gait Assistance: 5: Supervision Ambulation/Gait Assistance Details (indicate cue type and reason): Supervision for occasional cross over stepping.  Ambulation Distance (Feet): 250 Feet Assistive device: None (IV pole and none) Gait Pattern: Step-through pattern;Trunk flexed Stairs: Yes Stairs Assistance: 5: Supervision;4: Min assist Stairs Assistance Details (indicate cue type and reason): Min assist for 1 loss of balance, otherwise supervision Stair Management Technique: No rails;One rail Right;Step to pattern (one rail progressing to no rails) Number of Stairs: 6     Exercise  General Exercises - Lower Extremity Mini-Sqauts: AROM;Strengthening;Both;15 reps;Standing (holding onto sink ) End of Session PT - End of Session Equipment Utilized During Treatment:  (none) Activity Tolerance: Patient tolerated treatment well Patient left: in chair;with call bell in reach Nurse Communication: Mobility status for ambulation General Behavior During Session: [facility] for tasks performed Cognition: [facility] for tasks performed  [name] 12/24/2010, 10:37 AM  [name]) Carleene Mains PT, DPT Acute Rehabilitation [phone]
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
## 2010-12-24 NOTE — Progress Notes (Signed)
CSW completed psychiatry assessment and placed in shadow chart. CSW also attempted to call patient's mother but had to leave a message. Patient reports no current SI or HI. Unk Lightning [phone] (Covering for Dahlia Client Nail)
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
## 2010-12-25 ENCOUNTER — Encounter (HOSPITAL_COMMUNITY): Payer: Self-pay | Admitting: *Deleted

## 2010-12-25 ENCOUNTER — Telehealth (HOSPITAL_COMMUNITY): Payer: Self-pay | Admitting: Psychiatry

## 2010-12-25 ENCOUNTER — Inpatient Hospital Stay (HOSPITAL_COMMUNITY)
Admission: AD | Admit: 2010-12-25 | Discharge: 2010-12-31 | DRG: 881 | Disposition: A | Discharge status: Home / Self Care | Payer: Self-pay | Source: Ambulatory Visit | Attending: Psychiatry | Admitting: Psychiatry

## 2010-12-25 DIAGNOSIS — F4321 Adjustment disorder with depressed mood: Principal | ICD-10-CM

## 2010-12-25 DIAGNOSIS — S90412A Abrasion, left great toe, initial encounter: Secondary | ICD-10-CM | POA: Diagnosis present

## 2010-12-25 DIAGNOSIS — S3690XA Unspecified injury of unspecified intra-abdominal organ, initial encounter: Secondary | ICD-10-CM

## 2010-12-25 DIAGNOSIS — Z886 Allergy status to analgesic agent status: Secondary | ICD-10-CM

## 2010-12-25 DIAGNOSIS — F909 Attention-deficit hyperactivity disorder, unspecified type: Secondary | ICD-10-CM

## 2010-12-25 DIAGNOSIS — Z818 Family history of other mental and behavioral disorders: Secondary | ICD-10-CM

## 2010-12-25 DIAGNOSIS — X838XXA Intentional self-harm by other specified means, initial encounter: Secondary | ICD-10-CM

## 2010-12-25 MED ORDER — DEXTROMETHORPHAN POLISTIREX 30 MG/5ML PO LQCR
15.0000 mg | Freq: Two times a day (BID) | ORAL | Status: DC | PRN
  Filled 2010-12-25: qty 5

## 2010-12-25 MED ORDER — BACITRACIN ZINC 500 UNIT/GM EX OINT
TOPICAL_OINTMENT | Freq: Two times a day (BID) | CUTANEOUS | Status: DC
Start: 2010-12-25 — End: 2010-12-25
  Administered 2010-12-25: 10:00:00 via TOPICAL
  Filled 2010-12-25: qty 15

## 2010-12-25 NOTE — Progress Notes (Signed)
Clinical Social Work reviewed psych recommendations.  Faxed referral to [facility] for inpatient admission.  Awaiting review of clinicals and bed.  Will follow up with behavioral health.  Plan at this time is inpatient once bed available.    Ashley Jacobs, MSW LCSWA [phone]
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
## 2010-12-25 NOTE — Progress Notes (Signed)
Pt accepted to [facility] and pt agreeable to tx.  [facility] called for transport.  Support paperwork faxed to [facility].

## 2010-12-25 NOTE — Progress Notes (Signed)
Agree Await psych F/U to see if patient may be cleared for D/C [facility]
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
## 2010-12-25 NOTE — Progress Notes (Signed)
This Clinical Social Worker is signing off at this time.  Per MD, patient requiring inpatient psychiatric needs.  CSW, Dahlia Client Nail, will continue to follow patient at this time to facilitate discharge plans to [facility].    [address] [place], [place] [phone]

## 2010-12-26 DIAGNOSIS — F4320 Adjustment disorder, unspecified: Secondary | ICD-10-CM

## 2010-12-26 MED ORDER — CARBAMAZEPINE 200 MG PO TABS
200.0000 mg | ORAL_TABLET | Freq: Three times a day (TID) | ORAL | Status: DC
  Administered 2010-12-26 – 2010-12-31 (×15): 200 mg via ORAL
  Filled 2010-12-26 (×5): qty 1
  Filled 2010-12-26: qty 21
  Filled 2010-12-26 (×6): qty 1
  Filled 2010-12-26: qty 21
  Filled 2010-12-26 (×2): qty 1
  Filled 2010-12-26: qty 21
  Filled 2010-12-26 (×7): qty 1

## 2010-12-26 MED ORDER — ACETAMINOPHEN 325 MG PO TABS: 650.0000 mg | ORAL_TABLET | Freq: Four times a day (QID) | ORAL | Status: DC | PRN

## 2010-12-26 MED ORDER — COLCHICINE 0.6 MG PO TABS
0.6000 mg | ORAL_TABLET | Freq: Two times a day (BID) | ORAL | Status: DC
  Administered 2010-12-26 – 2010-12-31 (×12): 0.6 mg via ORAL
  Filled 2010-12-26 (×5): qty 1
  Filled 2010-12-26: qty 14
  Filled 2010-12-26 (×6): qty 1
  Filled 2010-12-26: qty 14
  Filled 2010-12-26 (×4): qty 1

## 2010-12-26 MED ORDER — ALUM & MAG HYDROXIDE-SIMETH 200-200-20 MG/5ML PO SUSP: 30.0000 mL | ORAL | Status: DC | PRN

## 2010-12-26 MED ORDER — DIPHENHYDRAMINE HCL 25 MG PO CAPS
50.0000 mg | ORAL_CAPSULE | Freq: Every evening | ORAL | Status: DC | PRN
  Administered 2010-12-27 – 2010-12-30 (×4): 50 mg via ORAL

## 2010-12-26 MED ORDER — HYDROCODONE-ACETAMINOPHEN 5-325 MG PO TABS
2.0000 | ORAL_TABLET | Freq: Four times a day (QID) | ORAL | Status: DC | PRN
  Administered 2010-12-26 – 2010-12-31 (×11): 2 via ORAL
  Filled 2010-12-26 (×3): qty 2
  Filled 2010-12-26: qty 1
  Filled 2010-12-26: qty 2
  Filled 2010-12-26: qty 1
  Filled 2010-12-26 (×5): qty 2

## 2010-12-26 MED ORDER — MAGNESIUM HYDROXIDE 400 MG/5ML PO SUSP: 30.0000 mL | Freq: Every day | ORAL | Status: DC | PRN
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
## 2010-12-26 NOTE — Progress Notes (Signed)
Interdisciplinary Treatment Plan Update (Adult)  Date:  12/26/2010  Time Reviewed:  10:24 AM   Progress in Treatment: Attending groups: Yes Participating in groups:  Yes Taking medication as prescribed:  Yes Tolerating medication: Yes Family/Significant othe contact made:  Exploring contact options, and whether Andrzej will provide consent  Patient understands diagnosis: Yes Discussing patient identified problems/goals with staff:  Yes Medical problems stabilized or resolved: Yes Denies suicidal/homicidal ideation: Yes Issues/concerns per patient self-inventory:  No  Other:  New problem(s) identified: None  Reason for Continuation of Hospitalization: Anxiety Depression Medication stabilization  Interventions implemented related to continuation of hospitalization:  Medication stabilization, safety checks q 15 mins, group attendance, mood stabilization  Additional comments:  Estimated length of stay: 3-5 days  Discharge Plan: Discharge to live with aunt, follow-up appointments to be set by case  manager  New goal(s):  Review of initial/current patient goals per problem list:   1.  Goal(s): Decrease depressive symptoms  Met:  No  Target date: by discharge  As evidenced by: Decreasing rating of depression from 8 to 3  2.  Goal (s): Eiminate suicidal thoughts  Met:  No  Target date: by discharge  As evidenced by: [name] reporting no suicidal thoughts  3.  Goal(s): Decrease anger episodes  Met:  No  Target date: by discharge  As evidenced by: [name] rating anger decrease from 10 to 4   Attendees: Patient:   11/26/201210:15 AM  Family:   11/26/201210:15 AM  Physician:  Dr Orson Aloe, MD 11/26/201210:15 AM  Nursing:   Omelia Blackwater, RN 11/26/201210:15 AM  Case Manager:  Juline Patch, LCSW 11/26/201210:15 AM  Counselor:  Angus Palms, LCSW 11/26/201210:15 AM  Other:  Reyes Ivan, LCSW 11/26/201210:15 AM  Other:  Quintella Reichert 11/26/201210:15 AM    Other:   11/26/201210:15 AM  Other:   11/26/201210:15 AM   Scribe for Treatment Team:   [name], 12/24/2010, 10:15 AM
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
## 2010-12-26 NOTE — Progress Notes (Signed)
Patient ID: Luis Luna, male   DOB: [birth year], 30 y.o.   MRN: [id number]  Patient is a new admit this evening. Received orders after patient was admitted by another nurse. Patient had complaints of pain this evening from self inflicted lacerations to abdomen and L arm. Medicated with norco 10-325 2 tablets, pain rated 4. Polite and appropriate this evening. Took medications without incident. Patient was requesting at all possible for a larger bed, as he is 6'10. AC was notified and a sticky note will be placed in this electronic chart to alert patient's treatment team members.

## 2010-12-26 NOTE — Progress Notes (Signed)
[facility] Group Notes:  (Counselor/Nursing/MHT/Case Management/Adjunct)  12/26/2010 1:36 PM  Type of Therapy:  Group THerapy  Participation Level:  Active  Participation Quality:  Intrusive, Monopolizing and Resistant  Affect:  Angry  Cognitive:  Alert and Appropriate  Insight:  Limited  Engagement in Group:  Good  Engagement in Therapy:  Limited  Modes of Intervention:  Support and Exploration  Summary of Progress/Problems: Khary was very engaged, but would not stay focused on the topic. He appeared to be offended by another group member's opinion and stated that he felt attacked. However, Lambros spent the majority of the group trying to disprove the other group member, who was not arguing with him, and came off as attacking others. He would not be redirected to focus on his anger, and seemed potentially aggressive to the point that other multiple other group members expressed discomfort. Brigg spoke over the top of other group members and counselor, and would not allow others to finish their points.    [name] 12/26/2010, 1:36 PM
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
## 2010-12-26 NOTE — Tx Team (Signed)
Initial Interdisciplinary Treatment Plan  PATIENT STRENGTHS: (choose at least two) Ability for insight Communication skills General fund of knowledge Special hobby/interest  PATIENT STRESSORS: Occupational concerns   PROBLEM LIST: Problem List/Patient Goals Date to be addressed Date deferred Reason deferred Estimated date of resolution  depression 12/25/10     Risk for suicide 12/25/10                                                DISCHARGE CRITERIA:  Ability to meet basic life and health needs Adequate post-discharge living arrangements Improved stabilization in mood, thinking, and/or behavior Reduction of life-threatening or endangering symptoms to within safe limits Verbal commitment to aftercare and medication compliance  PRELIMINARY DISCHARGE PLAN: Attend aftercare/continuing care group Outpatient therapy Return to previous living arrangement  PATIENT/FAMIILY INVOLVEMENT: This treatment plan has been presented to and reviewed with the patient, Luis Luna, and/or family member.  The patient and family have been given the opportunity to ask questions and make suggestions.  Luis Luna 12/26/2010, 2:10 AM

## 2010-12-26 NOTE — Progress Notes (Signed)
Looked at patient's wounds earlier this evening. Wounds have steri-strips. No signs of infection, no drainage, no redness. Instructed patient to try to keep wounds clean and dry until seen by MD to develop plan of care of treating/dressing wounds.

## 2010-12-26 NOTE — Progress Notes (Signed)
Recreation Therapy Group Note  Date: 12/26/10  Time: 1415   Group Topic/Focus: The focus of this group is on promoting emotional and psychological well-being through the process of creative expression, relaxation, socialization, fun and enjoyment.   Participation Level:  Active   Participation Quality:  Appropriate and Attentive   Affect:  Appropriate  Cognitive:  Oriented   Additional Comments: none.  Gurbani Figge  12/26/2010 4:13 PM  

## 2010-12-27 NOTE — Progress Notes (Addendum)
Pt appropriate interacting on unit with peers and staff. Pt reports that he is angry because roommate has not showered in days. Talked to roommate about shower issues and offered to move pt to [address]. Pt states that he has friends on [address] and would rather stay than change halls. Pt contracts that he will come to staff with additional anger issues. Safety maintained on unit.  Pt became upset with male pt during group due to different opinions and being confronted. Offered support and redirection. Reported to counselor. Pt continues to blame others, minimizing ager issues.

## 2010-12-27 NOTE — Progress Notes (Signed)
Utilization review completed. Nida Manfredi Diane11/29/2012  
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
## 2010-12-27 NOTE — Progress Notes (Signed)
[facility] Group Notes:  (Counselor/Nursing/MHT/Case Management/Adjunct)  12/27/2010 2:26 PM  Type of Therapy:  Group Therapy  Participation Level:  None  Participation Quality:  Inattentive  Affect:  Blunted  Cognitive:  Alert and Appropriate  Insight:  None  Engagement in Group:  None  Engagement in Therapy:  None  Modes of Intervention:  Support and Exploration  Summary of Progress/Problems:  Luis Luna initially left the group room when counselor came in, saying he was not going to sit through another of counselor's groups. Another group member asked him to come back into group, which he did but refused to engage. He sat through most of group with his eyes closed, and when others encouraged him speak up he refused to.    [name] 12/27/2010, 2:26 PM
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
## 2010-12-27 NOTE — Progress Notes (Signed)
Interdisciplinary Treatment Plan Update (Adult)  Date:  12/27/2010  Time Reviewed:  10:14 AM   Progress in Treatment: Attending groups:   Yes   Participating in groups:  Yes Taking medication as prescribed:  Yes Tolerating medication:  Yes Family/Significant othe contact made:  Patient understands diagnosis:  Yes Discussing patient identified problems/goals with staff: Yes Medical problems stabilized or resolved: Yes Denies suicidal/homicidal ideation:Yes Issues/concerns per patient self-inventory:  Other:  New problem(s) identified:  Reason for Continuation of Hospitalization: Medication management Interventions implemented related to continuation of hospitalization: Medication Management; safety checks q 15 mins Additional comments:  Estimated length of stay: 2-3 days  Discharge Plan: Home with outpatient services at [facility] goal(s):  Review of initial/current patient goals per problem list:   1.  Goal(s): Eliminate SI  Met:  Yes  Target date: d/c  As evidenced by:  Luis Luna denies SI  2.  Goal (s):  Reduce Anger   Met:  Yes  Target date:   d/c  As evidenced by:  Luis Luna is no endorsing anger at this time  3.  Goal(s):  .stabilize on meds   Met:  no  Target date:  d/c  As evidenced by:  Report that medications are working  4.  Goal(s):  Met:    Target date:  As evidenced by:  Attendees: Patient:     Family:     Physician:  Orson Aloe, MD 12/27/2010 10:14 AM   Nursing:   Waynetta Sandy, RN 12/27/2010 10:14 AM   CaseManager:  Juline Patch, LCSW 12/27/2010 10:14 AM   Counselor:  Angus Palms, LCSW 12/27/2010 10:14 AM   Other:  Omelia Blackwater 12/27/2010 10:14 AM   Other:  Townsend Roger, Counseling Intern 12/27/2010  10:14 AM  Other:     Other:      Scribe for Treatment Team:   Wynn Banker, LCSW,  12/27/2010 10:14 AM

## 2010-12-28 MED ORDER — MUSCLE RUB 10-15 % EX CREA: TOPICAL_CREAM | CUTANEOUS | Status: DC | PRN

## 2010-12-28 NOTE — Progress Notes (Signed)
Patient ID: Luis Luna, male   DOB: 14-Oct-1980, 30 y.o.   MRN: [id number]   Patient has a flat affect on approach today. Reports depression "4" on scale with "0" feelings of hopelessness. Laughing and joking with peers this afternoon. No PRN medication given from undersigned. Staff will monitor and encourage group attendance.
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
## 2010-12-28 NOTE — Progress Notes (Signed)
Patient ID: Luis Luna, male   DOB: 1980-06-11, 30 y.o.   MRN: [id number] Resting quietly with eyes closed. Respirations even and unlabored. No distress noted. Q 15 minute checks continues as ordered.

## 2010-12-28 NOTE — Progress Notes (Addendum)
Patient seen during during d/c planning group.  He denies SI/HI, hopelessness and helplessness.  He rates depression at one.  MD advised of stabilizing patient on meds and plans for d/c tomorrow.  Suicide prevention reviewed during group.  Patient will be assisted with indigent meds.  He will be seen outpatient by [facility] walk-in clinic. Suicide prevention education reviewed during group.

## 2010-12-28 NOTE — Progress Notes (Signed)
Recreation Therapy Group Note  Date: 12/28/2010          Time: 1415      Group Topic/Focus: The focus of the group is on enhancing the patients' ability to cope with stressors by understanding what coping is, why it is important, the negative effects of stress and developing healthier coping skills. Group also practiced progressive muscle relaxation, guided imagery, and meditation.   Participation Level: Minimal  Participation Quality: Monopolizing and Drowsy  Affect: Labile  Cognitive: Oriented   Additional Comments: Patient reports he believes he should be allowed to pick the group topics, was offering therapy services to peers. When patient was encouraged to focus on himself he went to sleep.   Luis Luna 12/28/2010 4:00 PM

## 2010-12-28 NOTE — Progress Notes (Addendum)
[facility] Group Notes:  (Counselor/Nursing/MHT/Case Management/Adjunct) Type of Therapy:  Group Therapy  Participation Level:  Minimal  Participation Quality:  Drowsy  Affect:  Depressed  Cognitive:  Alert  Insight:  None  Engagement in Group:  Limited  Engagement in Therapy:  None  Modes of Intervention:  Education and Exploration  Summary of Progress/Problems:  Calub kept dozing off and banging his head on the window. He reported he has lots of energy but cannot stay awake when he sits still.   [name] 12/28/2010 3:20 PM

## 2010-12-28 NOTE — Progress Notes (Signed)
Patient ID: Luis Luna, male   DOB: 12-07-1980, 30 y.o.   MRN: [id number]  Patient polite and appropriate this evening. No complaints or concerns verbalized this evening. Patient took medications without incident. This evening patient had three steri-strips that were falling off of abdomen and were replaced. Wound showed no signs of infection, no reddness, no drainage. Patient did verbalize that he believes tegretol is causing him to be drowsy.

## 2010-12-28 NOTE — Progress Notes (Signed)
Patient ID: Luis Luna, male   DOB: [birth year], 30 y.o.   MRN: [id number]  S: "I feel energetic and sad the same time. I kind feel depressed now.       The doctor told me a while ago that I am being discharged. I"m afraid because I don't know what I will do if I get one of my black-out episodes.        I'm still trying to learn some tools to help me cope.  O: Pt. Is teary, however, making good eye contact. Was observed joking and laughing with the other Pts. Pt. Denies SIHI, AVH.  A: Impulse control disorder.  P: Discussed patients concerns with Md and Child psychotherapist. Will continue with current treatment plan.

## 2010-12-28 NOTE — Progress Notes (Signed)
[facility] Group Notes:  (Counselor/Nursing/MHT/Case Management/Adjunct)  12/28/2010 2:18 PM  Type of Therapy:  Group Therapy  Participation Level:  None  Participation Quality:  Appropriate and Attentive  Affect:  Appropriate  Cognitive:  Alert and Appropriate  Insight:  None  Engagement in Group:  None  Engagement in Therapy:  None  Modes of Intervention:  Support and Exploration  Summary of Progress/Problems:  Luis Luna was present but did not seem very engaged in group.   [name] 12/28/2010, 2:18 PM

## 2010-12-28 NOTE — Progress Notes (Signed)
[facility] Adult Inpatient Family/Significant Other Suicide Prevention Education  Suicide Prevention Education:  Patient Refusal for Family/Significant Other Suicide Prevention Education: The patient Carolyn Maniscalco has refused to provide written consent for family/significant other to be provided Family/Significant Other Suicide Prevention Education during admission and/or prior to discharge.  Physician notified.  Yuval reported that he does not want anyone contacted, and that he has a cousin but the cousin is not supportive and he does not want cousin to have information. He stated that his agent is preparing an apartment for him at discharge, but that he also does not want his agent informed. Counselor reviewed and gave [place] suicide prevention education pamphlet and card for [facility].   [name] 12/28/2010, 4:40 PM

## 2010-12-29 DIAGNOSIS — F4323 Adjustment disorder with mixed anxiety and depressed mood: Secondary | ICD-10-CM

## 2010-12-29 NOTE — Progress Notes (Signed)
  Luis Luna is a 30 y.o. male [id number] Jan 17, 1981  12/25/2010 Active Problems:  * No active hospital problems. *    Mental Status: Alert & oriented denies SI. Wants"help" and requests to stay through the weekend to learn and practice better coping skills.     Filed Vitals:   12/29/10 0649  BP: 125/69  Pulse: 97  Temp:   Resp:     Lab Results:   BMET    Component Value Date/Time   NA 138 12/22/2010 0500   K 4.3 12/22/2010 0500   CL 98 12/22/2010 0500   CO2 30 12/22/2010 0500   GLUCOSE 102* 12/22/2010 0500   BUN 10 12/22/2010 0500   CREATININE 1.03 12/22/2010 0500   CALCIUM 9.5 12/22/2010 0500   GFRNONAA >90 12/22/2010 0500   GFRAA >90 12/22/2010 0500    Medications:  Scheduled:     . carbamazepine  200 mg Oral TID  . colchicine  0.6 mg Oral BID     PRN Meds acetaminophen, alum & mag hydroxide-simeth, diphenhydrAMINE, HYDROcodone-acetaminophen, magnesium hydroxide, Muscle Rub Plan:  Continue current plan of care [name],MICKIE D. 12/29/2010

## 2010-12-29 NOTE — Progress Notes (Signed)
[name], Luis Luna showed case manger note from Luis Luna that was done on 12/28/10. In the note the doctor stated that the  Pt. had requested  to stay at [facility] until Monday due to the fact that the pt. felt he had not solved all of his anger issues and his problems with having  blackouts. [name], Luis Luna stated that the case manger needed to go see Luis Luna. Luis Luna and case manger read the note given by Luis Galloway, Luis Luna and case manger went to talk with pt. to discuss d/c and his wanting to stay until Monday. Pt. Denied SI or HI thoughts but states he is still having problems with   His depression and blackouts and wants to explore this further in conversations with the doctor. Pt. stated that no one had confirmed he was leaving today on Friday after talking to his counselor  was told  that the decision would be up to the doctor and that the case manger would go talk with the [facility] nd case manger on Friday(Luis Luna). Pt. stated he talked with Luis Luna on 12/28/10 and that he understood that he would be responsible for the hospital bill and the amount sated in the note . Pt. states he has not gotten all the help he needs for his depression and has fears about future blackouts if he leaves the hospital. The pt. States she is not ready for d/c ans was told by the case manger that he has follow up with [employer]. Pt. stated [place] his agent, but that he would have to call him to get the information. Pt. Had a scheduled  follow up appointment in Epic for December 31, 2010 for the walk in Clinic at AM-12 AM. This appointment will need to be changed if the ppt. Is not d/c today. Case manger went to speak with Luis Luna, the [facility] and she stated  that  Luis Luna stated that the pt. could stay if he felt he needed to sat after the [place] called Luis Luna.

## 2010-12-29 NOTE — Progress Notes (Signed)
Patient ID: Luis Luna, male   DOB: [birth year], 30 y.o.   MRN: [id number]  [facility] Group Notes:  (Counselor/Nursing/MHT/Case Management/Adjunct)  12/29/2010 1:15 PM  Type of Therapy:  Group Therapy  Participation Level:  Active  Participation Quality:  Appropriate  Affect:  Appropriate  Cognitive:  Appropriate  Insight:  Limited  Engagement in Group:  Good  Engagement in Therapy:  Good  Modes of Intervention:  Clarification, Problem-solving, Role-play, Socialization and Support  Summary of Progress/Problems:   Group identified what the signs are of self-sabotaging behaviors. Group members stated at least one positive behavior to change the identified sabotaging behavior. Pt shared that he is "good" and that the stress ball he made has been helping him. He mentioned that helping his family too much is his self-sabotaging behavior. He appropriately engaged in group discussion by sharing his personal experiences. He stated that he is going to stay away from the wrong people in his life so that he does not self-sabotage.   Thomasena Edis, [employer]

## 2010-12-29 NOTE — Progress Notes (Signed)
Pt. attended and participated in aftercare planning group. Suicide prevention information was given as well as suicide risk warning sign information and crisis line numbers to use.  Pt. Stated that he has blackouts when he is angry. Pt. Did not have any questions or concerns regarding D/C.

## 2010-12-30 NOTE — Progress Notes (Signed)
  Luis Luna is a 30 y.o. male [id number] July 17, 1980  12/25/2010 Active Problems:  * No active hospital problems. *    Mental Status: alert dressed active in [place]. Still intrusive although no formal complaints from other patients. Reporting severe abdominal pain -but affect and behavior not congruent. Has butterfly bandages that can be removed. Incision is clean dry and well healed.  Filed Vitals:   12/30/10 0800  BP: 120/65  Pulse: 70  Temp: 97.5 F (36.4 C)  Resp:     Lab Results:   BMET    Component Value Date/Time   NA 138 12/22/2010 0500   K 4.3 12/22/2010 0500   CL 98 12/22/2010 0500   CO2 30 12/22/2010 0500   GLUCOSE 102* 12/22/2010 0500   BUN 10 12/22/2010 0500   CREATININE 1.03 12/22/2010 0500   CALCIUM 9.5 12/22/2010 0500   GFRNONAA >90 12/22/2010 0500   GFRAA >90 12/22/2010 0500    Medications:  Scheduled:     . carbamazepine  200 mg Oral TID  . colchicine  0.6 mg Oral BID     PRN Meds acetaminophen, alum & mag hydroxide-simeth, diphenhydrAMINE, HYDROcodone-acetaminophen, magnesium hydroxide, Muscle Rub  Plan: is to be discharged tomorrow.  Ahman Dugdale,MICKIE D. 12/30/2010
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
## 2010-12-30 NOTE — Progress Notes (Signed)
Patient ID: Luis Luna, male   DOB: 1980/10/10, 30 y.o.   MRN: [id number] Nursing: Pt. Denies lethality and A/V/H's: Pt. Has been interacting with several peers and actively engaged in group/conversations.  Pt. Often repeats his basketball status and has been overheard giving "advice" to peers. Pt. has been redirectable, if argumentative in the process.

## 2010-12-30 NOTE — Progress Notes (Signed)
[facility] Group Notes:  (Counselor/Nursing/MHT/Case Management/Adjunct)  12/30/2010 1315PM  Type of Therapy:  Psychoeducational Skills  Participation Level:  Minimal  Participation Quality:  Intrusive, Monopolizing and Resistant  Affect:  Appropriate  Cognitive:  Appropriate  Insight:  None  Engagement in Group:  Limited  Engagement in Therapy:  Limited  Modes of Intervention:  Clarification, Problem-solving and Support  Summary of Progress/Problems: Pt. participated in group session on supports. Pt. was asked what support means to them, who are their supports, what is the difference between unhealthy and healthy supports and what they can do when their support is not there. Pt. Stated that he does not have a support. Pt. Was child-like during group and did not want to participate. Pt. Fell asleep during group and did not make an effort to be engaged.    Keierra Nudo 12/30/2010, 3:15 PM

## 2010-12-31 MED ORDER — CARBAMAZEPINE 200 MG PO TABS: 200.0000 mg | ORAL_TABLET | Freq: Three times a day (TID) | ORAL | Status: DC

## 2010-12-31 MED ORDER — COLCHICINE 0.6 MG PO TABS: 0.6000 mg | ORAL_TABLET | Freq: Two times a day (BID) | ORAL | Status: DC
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
## 2010-12-31 NOTE — Discharge Summary (Signed)
Discharge Summary  Patient:  Wise Fees is an 30 y.o., male DOB:  May 04, 1980  Date of Admission:  12/25/2010  Date of Discharge:  12/31/2010  Axis Diagnosis:  Axis I: Depressive Disorder secondary to general medical condition Axis II: Deferred Axis III: No past medical history on file. Axis IV: occupational problems and other psychosocial or environmental problems Axis V: 51-60 moderate symptoms  Hospital Course: Pt was admitted and placed on Tegretol.  He participated in individual and group therapy.  He seemed to apply what he learned from the sessions.  He felt that the Tegretol helped him control his anger and agreed to take it as an outpatient.  Some of what he was confronted about in the hospital was his tendency to be dominate with others and his sense of trying to solve other people's problems for them.  Condition on Discharge: Patient denies suicidal or homicidal ideation, hallucinations, illusions, or delusions. Patient engages with good eye contact, is able to focus adequately in a one to one setting, and has clear goal directed thoughts. Patient speaks with a natural conversational volume, rate, and tone. Anxiety was reported at 1 on a scale of 1 the least and 10 the most. Depression was reported at 1 on the same scale. Patient is oriented times 4, recent and remote memory intact. Judgement: Improved from admission Insight: improved from admission Plan: Follow-up at [facility] [place].  Medications: . carbamazepine  200 mg Oral TID  . colchicine  0.6 mg Oral BID    Emslee Lopezmartinez 12/31/2010, 11:46 AM

## 2010-12-31 NOTE — Progress Notes (Signed)
[facility] Group Notes:  (Counselor/Nursing/MHT/Case Management/Adjunct)  12/31/2010 2:29 PM  Type of Therapy:  Group Therapy  Participation Level:  None  Participation Quality:  Resistant  Affect:  Irritable  Cognitive:  Appropriate  Insight:  None  Engagement in Group:  None  Engagement in Therapy:  None  Modes of Intervention:  Support and Exploration  Summary of Progress/Problems: Luis Luna was making jokes before group and this was addressed by a tech in the room as inappropriate. He became angry at the tech, and asked if he could "get discharged right now since I'm paying for this myself." When counselor explained the discharge process he appeared to become more frustrated, muttering to himself under his breath. He was then not at all engaged in group.   Luis Luna 12/31/2010, 2:29 PM

## 2010-12-31 NOTE — Discharge Summary (Signed)
Discharge Note  Patient:  Luis Luna is an 30 y.o., male DOB:  08/16/1980  Date of Admission:  12/25/2010  Date of Discharge:  12/31/2010  Level of Care:  OP  Discharge destination:  Home  Is patient on multiple antipsychotic therapies at discharge:  No    Patient phone:  [phone] (home)  Patient address:   [address] [place] [address],   The patient received suicide prevention pamphlet:  No Belongings returned:  [name], [name] 12/31/2010, 11:45 AM

## 2010-12-31 NOTE — Progress Notes (Signed)
Interdisciplinary Treatment Plan Update (Adult)  Date:  12/31/2010  Time Reviewed:  10:02 AM   Progress in Treatment: Attending groups:   Yes   Participating in groups:  Yes Taking medication as prescribed:  Yes Tolerating medication:  Yes Family/Significant othe contact made:  Patient understands diagnosis:  Yes Discussing patient identified problems/goals with staff: Yes Medical problems stabilized or resolved: Yes Denies suicidal/homicidal ideation:Yes - Luis Luna continues to deny SI Issues/concerns per patient self-inventory:  Other:  New problem(s) identified:  Reason for Continuation of Hospitalization:   Interventions implemented related to continuation of hospitalization:   Additional comments:  Suicide prevention reviewed during group Assisted [place] with indigent Medications Follow with Family Services  Estimated length of stay:  D/C today  Discharge Plan:  Home with friend with outpatient follow up at [facility] goal(s):  Review of initial/current patient goals per problem list:   1.  Goal(s):  Reduce SI  Met:  Yes  Target date:  D/c  As evidenced by:  Reporting no SI   2.  Goal (s):  Reduce anger  Met:  Yes  Target date:  Reports no anger  As evidenced by:  [name] is not endorsing any anger, depression and anxiety  3.  Goal(s):  .stabilize on meds   Met:  Yes  Target date:  D/c  As evidenced by:  Reports medications are working  4.  Goal(s):  Met:    Target date:  As evidenced by:  Attendees: Patient:     Family:     Physician:  Orson Aloe, MD 12/31/2010 10:02 AM   Nursing:   Neill Loft, NR 12/31/2010 10:02 AM   CaseManager:  Juline Patch, LCSW 12/31/2010 10:02 AM   Counselor:  Angus Palms, LCSW 12/31/2010 10:02 AM   Other:  Quintella Reichert, RN 12/31/2010 10:02 AM   Other:     Other:     Other:      Scribe for Treatment Team:   Wynn Banker, LCSW,  12/31/2010 10:02 AM

## 2010-12-31 NOTE — Progress Notes (Signed)
Patient seen during discharge planning group and treatment team.  He denies any SI/HI, anxiety, hopelessness and helplessness.  He reports plans to stay with a friend.  Patient advised that he has funds to purchase medications but was assisted with a five day supply of medications.  Suicide prevention education reviewed and follow up to be with [facility] on Tuesday, December 31, 2010.  Barrier to discharge is lack of permanent housing.

## 2010-12-31 NOTE — Discharge Summary (Signed)
Agree Tamura Lasky E  
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
## 2011-01-09 ENCOUNTER — Emergency Department (HOSPITAL_COMMUNITY)
Admission: EM | Admit: 2011-01-09 | Discharge: 2011-01-09 | Disposition: A | Discharge status: Home / Self Care | Payer: Self-pay | Attending: Emergency Medicine | Admitting: Emergency Medicine

## 2011-01-09 ENCOUNTER — Encounter (HOSPITAL_COMMUNITY): Payer: Self-pay | Admitting: Adult Health

## 2011-01-09 DIAGNOSIS — F17200 Nicotine dependence, unspecified, uncomplicated: Secondary | ICD-10-CM | POA: Insufficient documentation

## 2011-01-09 DIAGNOSIS — IMO0002 Reserved for concepts with insufficient information to code with codable children: Secondary | ICD-10-CM | POA: Insufficient documentation

## 2011-01-09 DIAGNOSIS — Y839 Surgical procedure, unspecified as the cause of abnormal reaction of the patient, or of later complication, without mention of misadventure at the time of the procedure: Secondary | ICD-10-CM | POA: Insufficient documentation

## 2011-01-09 MED ORDER — CEPHALEXIN 500 MG PO CAPS: 500.0000 mg | ORAL_CAPSULE | Freq: Four times a day (QID) | ORAL | Status: AC

## 2011-01-09 MED ORDER — CEPHALEXIN 500 MG PO CAPS: 500.0000 mg | ORAL_CAPSULE | Freq: Four times a day (QID) | ORAL | Status: DC
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
## 2011-01-09 NOTE — ED Provider Notes (Signed)
Medical screening examination/treatment/procedure(s) were performed by non-physician practitioner and as supervising physician I was immediately available for consultation/collaboration.  Flint Melter, MD 01/09/11 [phone]

## 2011-01-10 ENCOUNTER — Encounter (INDEPENDENT_AMBULATORY_CARE_PROVIDER_SITE_OTHER): Payer: Self-pay
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
## 2012-09-20 ENCOUNTER — Emergency Department (HOSPITAL_COMMUNITY)
Admission: EM | Admit: 2012-09-20 | Discharge: 2012-09-20 | Disposition: A | Discharge status: Home / Self Care | Payer: Self-pay | Attending: Emergency Medicine | Admitting: Emergency Medicine

## 2012-09-20 ENCOUNTER — Encounter (HOSPITAL_COMMUNITY): Payer: Self-pay | Admitting: Emergency Medicine

## 2012-09-20 DIAGNOSIS — L299 Pruritus, unspecified: Secondary | ICD-10-CM | POA: Insufficient documentation

## 2012-09-20 DIAGNOSIS — R21 Rash and other nonspecific skin eruption: Secondary | ICD-10-CM | POA: Insufficient documentation

## 2012-09-20 DIAGNOSIS — Z87828 Personal history of other (healed) physical injury and trauma: Secondary | ICD-10-CM | POA: Insufficient documentation

## 2012-09-20 DIAGNOSIS — Z79899 Other long term (current) drug therapy: Secondary | ICD-10-CM | POA: Insufficient documentation

## 2012-09-20 DIAGNOSIS — F17200 Nicotine dependence, unspecified, uncomplicated: Secondary | ICD-10-CM | POA: Insufficient documentation

## 2012-09-20 HISTORY — DX: Laceration without foreign body of abdominal wall, unspecified quadrant without penetration into peritoneal cavity, initial encounter: S31.119A

## 2012-09-20 MED ORDER — FAMOTIDINE 20 MG PO TABS: 20.0000 mg | ORAL_TABLET | Freq: Two times a day (BID) | ORAL | Status: DC

## 2012-09-20 MED ORDER — HYDROCORTISONE 1 % EX CREA: TOPICAL_CREAM | Freq: Two times a day (BID) | CUTANEOUS | Status: DC

## 2012-09-20 MED ORDER — DIPHENHYDRAMINE HCL 25 MG PO CAPS
25.0000 mg | ORAL_CAPSULE | Freq: Once | ORAL | Status: AC
  Administered 2012-09-20: 25 mg via ORAL
  Filled 2012-09-20: qty 1

## 2012-09-20 MED ORDER — PREDNISONE 20 MG PO TABS: 40.0000 mg | ORAL_TABLET | Freq: Every day | ORAL | Status: DC

## 2012-09-20 MED ORDER — PREDNISONE 20 MG PO TABS
20.0000 mg | ORAL_TABLET | Freq: Once | ORAL | Status: AC
  Administered 2012-09-20: 20 mg via ORAL
  Filled 2012-09-20: qty 1

## 2012-09-20 MED ORDER — PERMETHRIN 5 % EX CREA: TOPICAL_CREAM | CUTANEOUS | Status: DC

## 2012-09-20 MED ORDER — FAMOTIDINE 20 MG PO TABS
20.0000 mg | ORAL_TABLET | Freq: Once | ORAL | Status: AC
  Administered 2012-09-20: 20 mg via ORAL
  Filled 2012-09-20: qty 1

## 2012-09-20 MED ORDER — DIPHENHYDRAMINE HCL 25 MG PO TABS: 25.0000 mg | ORAL_TABLET | Freq: Four times a day (QID) | ORAL | Status: DC | PRN
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
## 2012-09-20 NOTE — ED Notes (Signed)
C/o generalized rash/bumps all over x 1 week.

## 2012-09-21 NOTE — Progress Notes (Signed)
   CARE MANAGEMENT ED NOTE 09/21/2012  Patient:  Luis Luna, Luis Luna   Account Number:  [id number]  Date Initiated:  09/21/2012  Documentation initiated by:  Fransico Michael  Subjective/Objective Assessment:   presented to ED yesterday with c/o rash     Subjective/Objective Assessment Detail:     Action/Plan:   medication assistance   Action/Plan Detail:   Anticipated DC Date:  09/20/2012     Status Recommendation to Physician:   Result of Recommendation:      DC Planning Services  CM consult  MATCH Program    Choice offered to / List presented to:            Status of service:  Completed, signed off  ED Comments:   ED Comments Detail:  09/21/12-1006-J.Jezabel Lecker,RN,BSN [phone]      Assessed for elligibility to [facility]. Meets requirements. Enrolled in [facility] program. Letter printed. Attempted to contact patient at both phone numbers: [phone] and [phone]. No answer and no way to leave message. Notified nurse first and charge nurse to notify [facility] if patient comes back in for assistance.  09/21/12-0903-J.Reynold Mantell,RN,BSN [phone]      Received call from Cookie, CM reporting that patient was seen in ED last evening and requested medication assistance. Patient is supposed to return to ED today for [facility] assistance.

## 2012-09-21 NOTE — ED Provider Notes (Signed)
Medical screening examination/treatment/procedure(s) were performed by non-physician practitioner and as supervising physician I was immediately available for consultation/collaboration.   Shanna Cisco, MD 09/21/12 [phone]

## 2013-01-31 ENCOUNTER — Inpatient Hospital Stay (HOSPITAL_COMMUNITY)
Admission: EM | Admit: 2013-01-31 | Discharge: 2013-02-02 | DRG: 349 | Disposition: A | Discharge status: Home / Self Care | Payer: Self-pay | Attending: General Surgery | Admitting: General Surgery

## 2013-01-31 ENCOUNTER — Encounter (HOSPITAL_COMMUNITY): Payer: Self-pay | Admitting: Emergency Medicine

## 2013-01-31 DIAGNOSIS — K612 Anorectal abscess: Principal | ICD-10-CM | POA: Diagnosis present

## 2013-01-31 DIAGNOSIS — K611 Rectal abscess: Secondary | ICD-10-CM | POA: Diagnosis present

## 2013-01-31 LAB — POCT I-STAT, CHEM 8
BUN: 13 mg/dL (ref 6–23)
CHLORIDE: 100 meq/L (ref 96–112)
Calcium, Ion: 1.17 mmol/L (ref 1.12–1.23)
Creatinine, Ser: 1.3 mg/dL (ref 0.50–1.35)
Glucose, Bld: 90 mg/dL (ref 70–99)
HEMATOCRIT: 48 % (ref 39.0–52.0)
Hemoglobin: 16.3 g/dL (ref 13.0–17.0)
POTASSIUM: 3.8 meq/L (ref 3.7–5.3)
Sodium: 138 mEq/L (ref 137–147)
TCO2: 25 mmol/L (ref 0–100)

## 2013-01-31 LAB — CBC WITH DIFFERENTIAL/PLATELET
BASOS ABS: 0 10*3/uL (ref 0.0–0.1)
BASOS PCT: 0 % (ref 0–1)
Eosinophils Absolute: 0 10*3/uL (ref 0.0–0.7)
Eosinophils Relative: 0 % (ref 0–5)
HEMATOCRIT: 44 % (ref 39.0–52.0)
HEMOGLOBIN: 15.7 g/dL (ref 13.0–17.0)
LYMPHS PCT: 14 % (ref 12–46)
Lymphs Abs: 1.9 10*3/uL (ref 0.7–4.0)
MCH: 32 pg (ref 26.0–34.0)
MCHC: 35.7 g/dL (ref 30.0–36.0)
MCV: 89.8 fL (ref 78.0–100.0)
MONO ABS: 1.4 10*3/uL — AB (ref 0.1–1.0)
Monocytes Relative: 11 % (ref 3–12)
NEUTROS ABS: 10 10*3/uL — AB (ref 1.7–7.7)
NEUTROS PCT: 75 % (ref 43–77)
Platelets: 156 10*3/uL (ref 150–400)
RBC: 4.9 MIL/uL (ref 4.22–5.81)
RDW: 13.1 % (ref 11.5–15.5)
WBC: 13.4 10*3/uL — AB (ref 4.0–10.5)

## 2013-01-31 MED ORDER — MORPHINE SULFATE 4 MG/ML IJ SOLN
4.0000 mg | INTRAMUSCULAR | Status: DC | PRN
Start: 2013-01-31 — End: 2013-02-01
  Administered 2013-01-31 – 2013-02-01 (×4): 4 mg via INTRAVENOUS
  Filled 2013-01-31 (×4): qty 1

## 2013-01-31 MED ORDER — SODIUM CHLORIDE 0.9 % IV SOLN: 3.0000 g | Freq: Once | INTRAVENOUS | Status: DC

## 2013-01-31 MED ORDER — SODIUM CHLORIDE 0.9 % IV SOLN
INTRAVENOUS | Status: DC
  Administered 2013-01-31 – 2013-02-02 (×4): via INTRAVENOUS

## 2013-01-31 MED ORDER — OXYCODONE-ACETAMINOPHEN 5-325 MG PO TABS
2.0000 | ORAL_TABLET | Freq: Once | ORAL | Status: AC
  Administered 2013-01-31: 2 via ORAL
  Filled 2013-01-31: qty 2

## 2013-01-31 MED ORDER — SODIUM CHLORIDE 0.9 % IV SOLN
3.0000 g | Freq: Four times a day (QID) | INTRAVENOUS | Status: DC
  Administered 2013-01-31 – 2013-02-02 (×6): 3 g via INTRAVENOUS
  Filled 2013-01-31 (×10): qty 3

## 2013-01-31 MED ORDER — DIPHENHYDRAMINE HCL 50 MG/ML IJ SOLN
25.0000 mg | Freq: Once | INTRAMUSCULAR | Status: AC
  Administered 2013-01-31: 25 mg via INTRAVENOUS
  Filled 2013-01-31: qty 1

## 2013-01-31 NOTE — ED Notes (Signed)
RN completed patient teaching on abscess and care post drainage.

## 2013-01-31 NOTE — ED Provider Notes (Signed)
Patient with pain at rectum for the past 2 days. Unable to have bowel movement due to pain. On exam alert nontoxic note distress. Patient has draining perirectal abscess approximately 4 o clock position  Doug SouSam [place], MD 01/31/13 2120

## 2013-01-31 NOTE — ED Notes (Signed)
Escorted Twain HarteJessica PA for rectal pain exam. Pt has yellow pus draining from rectal area and very tender to touch- difficult to view
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
## 2013-01-31 NOTE — ED Notes (Signed)
Pt reports recent episode of constipation and diarrhea, now having hemorrhoids and anal pain. No acute distress noted at triage

## 2013-02-01 ENCOUNTER — Encounter (HOSPITAL_COMMUNITY)
Admission: EM | Disposition: A | Discharge status: Home / Self Care | Payer: Self-pay | Source: Home / Self Care | Attending: General Surgery

## 2013-02-01 ENCOUNTER — Encounter (HOSPITAL_COMMUNITY): Payer: MEDICAID | Admitting: Certified Registered"

## 2013-02-01 ENCOUNTER — Inpatient Hospital Stay (HOSPITAL_COMMUNITY): Payer: Self-pay | Admitting: Certified Registered"

## 2013-02-01 ENCOUNTER — Encounter (HOSPITAL_COMMUNITY): Payer: Self-pay | Admitting: Certified Registered"

## 2013-02-01 DIAGNOSIS — K612 Anorectal abscess: Secondary | ICD-10-CM

## 2013-02-01 HISTORY — PX: INCISION AND DRAINAGE PERIRECTAL ABSCESS: SHX1804

## 2013-02-01 SURGERY — INCISION AND DRAINAGE, ABSCESS, PERIRECTAL
Anesthesia: General

## 2013-02-01 MED ORDER — ACETAMINOPHEN 325 MG PO TABS: 650.0000 mg | ORAL_TABLET | Freq: Four times a day (QID) | ORAL | Status: DC | PRN

## 2013-02-01 MED ORDER — LIDOCAINE HCL (CARDIAC) 20 MG/ML IV SOLN
INTRAVENOUS | Status: DC | PRN
  Administered 2013-02-01: 60 mg via INTRAVENOUS

## 2013-02-01 MED ORDER — ACETAMINOPHEN 650 MG RE SUPP: 650.0000 mg | Freq: Four times a day (QID) | RECTAL | Status: DC | PRN

## 2013-02-01 MED ORDER — DIPHENHYDRAMINE HCL 50 MG/ML IJ SOLN
50.0000 mg | Freq: Four times a day (QID) | INTRAMUSCULAR | Status: DC | PRN
  Administered 2013-02-01 – 2013-02-02 (×2): 50 mg via INTRAVENOUS
  Filled 2013-02-01 (×2): qty 1

## 2013-02-01 MED ORDER — LACTATED RINGERS IV SOLN
INTRAVENOUS | Status: DC
  Administered 2013-02-01: 15:00:00 via INTRAVENOUS

## 2013-02-01 MED ORDER — BUPIVACAINE-EPINEPHRINE 0.25% -1:200000 IJ SOLN
INTRAMUSCULAR | Status: DC | PRN
  Administered 2013-02-01: 10 mL

## 2013-02-01 MED ORDER — ONDANSETRON HCL 4 MG/2ML IJ SOLN
4.0000 mg | Freq: Four times a day (QID) | INTRAMUSCULAR | Status: DC | PRN
Start: 2013-02-01 — End: 2013-02-02

## 2013-02-01 MED ORDER — SUCCINYLCHOLINE CHLORIDE 20 MG/ML IJ SOLN
INTRAMUSCULAR | Status: DC | PRN
  Administered 2013-02-01: 100 mg via INTRAVENOUS

## 2013-02-01 MED ORDER — PROPOFOL 10 MG/ML IV BOLUS
INTRAVENOUS | Status: DC | PRN
  Administered 2013-02-01: 200 mg via INTRAVENOUS
  Administered 2013-02-01: 50 mg via INTRAVENOUS

## 2013-02-01 MED ORDER — DIPHENHYDRAMINE HCL 12.5 MG/5ML PO ELIX: 12.5000 mg | ORAL_SOLUTION | Freq: Four times a day (QID) | ORAL | Status: DC | PRN

## 2013-02-01 MED ORDER — MORPHINE SULFATE 4 MG/ML IJ SOLN
1.0000 mg | INTRAMUSCULAR | Status: DC | PRN
Start: 2013-02-01 — End: 2013-02-02
  Administered 2013-02-01: 4 mg via INTRAVENOUS
  Filled 2013-02-01: qty 1

## 2013-02-01 MED ORDER — HYDROMORPHONE HCL PF 1 MG/ML IJ SOLN: 0.2500 mg | INTRAMUSCULAR | Status: DC | PRN

## 2013-02-01 MED ORDER — DOCUSATE SODIUM 100 MG PO CAPS
100.0000 mg | ORAL_CAPSULE | Freq: Two times a day (BID) | ORAL | Status: DC
  Administered 2013-02-01 – 2013-02-02 (×2): 100 mg via ORAL
  Filled 2013-02-01 (×4): qty 1

## 2013-02-01 MED ORDER — MIDAZOLAM HCL 5 MG/5ML IJ SOLN
INTRAMUSCULAR | Status: DC | PRN
  Administered 2013-02-01: 2 mg via INTRAVENOUS

## 2013-02-01 MED ORDER — OXYCODONE HCL 5 MG/5ML PO SOLN: 5.0000 mg | Freq: Once | ORAL | Status: DC | PRN

## 2013-02-01 MED ORDER — ONDANSETRON HCL 4 MG/2ML IJ SOLN: 4.0000 mg | Freq: Four times a day (QID) | INTRAMUSCULAR | Status: DC | PRN

## 2013-02-01 MED ORDER — OXYCODONE HCL 5 MG PO TABS: 5.0000 mg | ORAL_TABLET | Freq: Once | ORAL | Status: DC | PRN

## 2013-02-01 MED ORDER — OXYCODONE HCL 5 MG PO TABS
5.0000 mg | ORAL_TABLET | ORAL | Status: DC | PRN
  Administered 2013-02-01 – 2013-02-02 (×3): 10 mg via ORAL
  Filled 2013-02-01 (×4): qty 2

## 2013-02-01 MED ORDER — FENTANYL CITRATE 0.05 MG/ML IJ SOLN
INTRAMUSCULAR | Status: DC | PRN
  Administered 2013-02-01: 50 ug via INTRAVENOUS
  Administered 2013-02-01 (×2): 100 ug via INTRAVENOUS

## 2013-02-01 MED ORDER — ONDANSETRON HCL 4 MG/2ML IJ SOLN
INTRAMUSCULAR | Status: DC | PRN
  Administered 2013-02-01: 4 mg via INTRAVENOUS

## 2013-02-01 MED ORDER — BUPIVACAINE-EPINEPHRINE (PF) 0.25% -1:200000 IJ SOLN
INTRAMUSCULAR | Status: AC
  Filled 2013-02-01: qty 30

## 2013-02-01 MED ORDER — DIPHENHYDRAMINE HCL 50 MG/ML IJ SOLN
12.5000 mg | Freq: Four times a day (QID) | INTRAMUSCULAR | Status: DC | PRN
  Administered 2013-02-01: 25 mg via INTRAVENOUS
  Filled 2013-02-01: qty 1

## 2013-02-01 MED ORDER — 0.9 % SODIUM CHLORIDE (POUR BTL) OPTIME
TOPICAL | Status: DC | PRN
  Administered 2013-02-01: 1000 mL

## 2013-02-01 SURGICAL SUPPLY — 38 items
BLADE SURG 15 STRL LF DISP TIS (BLADE) ×1 IMPLANT
BLADE SURG 15 STRL SS (BLADE) ×3
CANISTER SUCTION 2500CC (MISCELLANEOUS) ×3 IMPLANT
COVER SURGICAL LIGHT HANDLE (MISCELLANEOUS) ×3 IMPLANT
DRAPE UTILITY 15X26 W/TAPE STR (DRAPE) ×12 IMPLANT
DRSG PAD ABDOMINAL 8X10 ST (GAUZE/BANDAGES/DRESSINGS) ×3 IMPLANT
ELECT CAUTERY BLADE 6.4 (BLADE) IMPLANT
ELECT REM PT RETURN 9FT ADLT (ELECTROSURGICAL) ×3
ELECTRODE REM PT RTRN 9FT ADLT (ELECTROSURGICAL) ×1 IMPLANT
GAUZE PACKING IODOFORM 1/4X5 (PACKING) ×2 IMPLANT
GLOVE BIO SURGEON STRL SZ7.5 (GLOVE) ×3 IMPLANT
GLOVE BIOGEL PI IND STRL 6.5 (GLOVE) IMPLANT
GLOVE BIOGEL PI IND STRL 8 (GLOVE) ×1 IMPLANT
GLOVE BIOGEL PI INDICATOR 6.5 (GLOVE) ×4
GLOVE BIOGEL PI INDICATOR 8 (GLOVE) ×2
GOWN STRL NON-REIN LRG LVL3 (GOWN DISPOSABLE) ×5 IMPLANT
GOWN STRL REIN XL XLG (GOWN DISPOSABLE) ×3 IMPLANT
KIT BASIN OR (CUSTOM PROCEDURE TRAY) ×3 IMPLANT
KIT ROOM TURNOVER OR (KITS) ×3 IMPLANT
NDL HYPO 25GX1X1/2 BEV (NEEDLE) IMPLANT
NEEDLE HYPO 25GX1X1/2 BEV (NEEDLE) ×3 IMPLANT
NS IRRIG 1000ML POUR BTL (IV SOLUTION) ×3 IMPLANT
PACK LITHOTOMY IV (CUSTOM PROCEDURE TRAY) ×3 IMPLANT
PAD ARMBOARD 7.5X6 YLW CONV (MISCELLANEOUS) ×5 IMPLANT
PENCIL BUTTON HOLSTER BLD 10FT (ELECTRODE) ×3 IMPLANT
SPONGE GAUZE 4X4 12PLY (GAUZE/BANDAGES/DRESSINGS) ×3 IMPLANT
SPONGE LAP 18X18 X RAY DECT (DISPOSABLE) ×3 IMPLANT
SWAB COLLECTION DEVICE MRSA (MISCELLANEOUS) ×2 IMPLANT
SYR BULB IRRIGATION 50ML (SYRINGE) ×2 IMPLANT
SYR CONTROL 10ML LL (SYRINGE) ×2 IMPLANT
TAPE CLOTH SURG 6X10 WHT LF (GAUZE/BANDAGES/DRESSINGS) ×2 IMPLANT
TOWEL OR 17X24 6PK STRL BLUE (TOWEL DISPOSABLE) ×3 IMPLANT
TOWEL OR 17X26 10 PK STRL BLUE (TOWEL DISPOSABLE) ×3 IMPLANT
TUBE ANAEROBIC SPECIMEN COL (MISCELLANEOUS) ×2 IMPLANT
TUBE CONNECTING 12'X1/4 (SUCTIONS) ×1
TUBE CONNECTING 12X1/4 (SUCTIONS) ×2 IMPLANT
UNDERPAD 30X30 INCONTINENT (UNDERPADS AND DIAPERS) ×3 IMPLANT
YANKAUER SUCT BULB TIP NO VENT (SUCTIONS) ×3 IMPLANT

## 2013-02-01 NOTE — Transfer of Care (Signed)
Immediate Anesthesia Transfer of Care Note  Patient: Luis GuiseKeith D Kullman  Procedure(s) Performed: Procedure(s): IRRIGATION AND DEBRIDEMENT PERIRECTAL ABSCESS (N/A)  Patient Location: PACU  Anesthesia Type:General  Level of Consciousness: responds to stimulation  Airway & Oxygen Therapy: Patient Spontanous Breathing and Patient connected to nasal cannula oxygen  Post-op Assessment: Report given to PACU RN and Post -op Vital signs reviewed and stable  Post vital signs: Reviewed and stable  Complications: No apparent anesthesia complications

## 2013-02-01 NOTE — ED Notes (Signed)
Pt up to the bathroom

## 2013-02-01 NOTE — Op Note (Signed)
02/01/2013  4:25 PM  PATIENT:  Luis Luna  33 y.o. male  PRE-OPERATIVE DIAGNOSIS:  Perirectal Abscess  POST-OPERATIVE DIAGNOSIS:  Perirectal Abscess  PROCEDURE:  Procedure(s): IRRIGATION AND DEBRIDEMENT PERIRECTAL ABSCESS (N/A)  SURGEON:  Surgeon(s) and Role:    * Axel FillerArmando Jaquelyn Sakamoto, MD - Primary  PHYSICIAN ASSISTANT:   ASSISTANTS: none   ANESTHESIA:   general  EBL:   5cc  BLOOD ADMINISTERED:none  DRAINS: none   LOCAL MEDICATIONS USED:  MARCAINE     SPECIMEN:  Source of Specimen:  Perirectal abscess   DISPOSITION OF SPECIMEN:  microbiology  COUNTS:  YES  TOURNIQUET:  * No tourniquets in log *  DICTATION: .Dragon Dictation Pt was taken back to the OR after consent.  He was placed in the supine position with bilateral SCDs in place.  He was prepped and draped in the usual sterile fashion.    A small 1 cm elliptical incision was made in the area of greatest flutuance.  A moderate amount of pus was encountered.  This was cultured.  All loculation were broken up.  It did not track to any other places.  The area was irrigated out with sterile saline.  The area was infiltrated with 1/4% marcaine.  It was packed with 1/4" iodoform guaze, and dressed with 4x4s, ABD pads and mesh panties.  He tolerated the procedure well and was taken to the PACU in stable condition.   PLAN OF CARE: Admit for overnight observation  PATIENT DISPOSITION:  PACU - hemodynamically stable.   Delay start of Pharmacological VTE agent (>24hrs) due to surgical blood loss or risk of bleeding: yes

## 2013-02-01 NOTE — Progress Notes (Signed)
Elmore GuiseKeith D Brunelli [id number] Patient just admitted by Dr. Donell BeersByerly.  Evaluate of the patient reveals an incompletely drained  Perirectal abscess.  Keep NPO and we will plan on OR later today for complete drainage.  This has been d/w the patient and the male who is with him.  They both agree with the plan.  Tasharra Nodine E 02/01/2013 8:12 AM

## 2013-02-01 NOTE — Progress Notes (Signed)
UR completed 

## 2013-02-01 NOTE — ED Notes (Signed)
Surgery at the bedside.

## 2013-02-01 NOTE — Progress Notes (Signed)
I have seen and examined the pt and agree with PA-Osborne's progress note.  

## 2013-02-01 NOTE — Anesthesia Postprocedure Evaluation (Signed)
  Anesthesia Post-op Note  Patient: Luis Luna  Procedure(s) Performed: Procedure(s): IRRIGATION AND DEBRIDEMENT PERIRECTAL ABSCESS (N/A)  Patient Location: PACU  Anesthesia Type:General  Level of Consciousness: awake, alert , oriented and patient cooperative  Airway and Oxygen Therapy: Patient Spontanous Breathing  Post-op Pain: none  Post-op Assessment: Post-op Vital signs reviewed, Patient's Cardiovascular Status Stable, Respiratory Function Stable, Patent Airway, No signs of Nausea or vomiting and Pain level controlled  Post-op Vital Signs: Reviewed and stable  Complications: No apparent anesthesia complications
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
## 2013-02-01 NOTE — Anesthesia Preprocedure Evaluation (Addendum)
Anesthesia Evaluation  Patient identified by MRN, date of birth, ID band Patient awake    Reviewed: Allergy & Precautions, H&P , NPO status , Patient's Chart, lab work & pertinent test results  Airway Mallampati: I TM Distance: >3 FB Neck ROM: Full    Dental  (+) Teeth Intact and Dental Advisory Given   Pulmonary Current Smoker,          Cardiovascular negative cardio ROS      Neuro/Psych    GI/Hepatic   Endo/Other    Renal/GU      Musculoskeletal   Abdominal   Peds  Hematology   Anesthesia Other Findings   Reproductive/Obstetrics                          Anesthesia Physical Anesthesia Plan  ASA: II  Anesthesia Plan: General   Post-op Pain Management:    Induction: Intravenous  Airway Management Planned: Oral ETT  Additional Equipment:   Intra-op Plan:   Post-operative Plan: Extubation in OR  Informed Consent: I have reviewed the patients History and Physical, chart, labs and discussed the procedure including the risks, benefits and alternatives for the proposed anesthesia with the patient or authorized representative who has indicated his/her understanding and acceptance.     Plan Discussed with: Anesthesiologist and Surgeon  Anesthesia Plan Comments:         Anesthesia Quick Evaluation

## 2013-02-01 NOTE — Anesthesia Procedure Notes (Signed)
Procedure Name: Intubation Date/Time: 02/01/2013 4:03 PM Performed by: Arlice ColtMANESS, Alek Poncedeleon B Pre-anesthesia Checklist: Patient identified, Emergency Drugs available, Suction available, Patient being monitored and Timeout performed Patient Re-evaluated:Patient Re-evaluated prior to inductionOxygen Delivery Method: Circle system utilized Preoxygenation: Pre-oxygenation with 100% oxygen Intubation Type: IV induction and Rapid sequence Laryngoscope Size: Miller and 4 Grade View: Grade I Tube type: Oral Tube size: 7.5 mm Number of attempts: 1 Airway Equipment and Method: Stylet Placement Confirmation: ETT inserted through vocal cords under direct vision,  positive ETCO2 and breath sounds checked- equal and bilateral Secured at: 25 cm Tube secured with: Tape Dental Injury: Teeth and Oropharynx as per pre-operative assessment

## 2013-02-02 MED ORDER — DSS 100 MG PO CAPS: 100.0000 mg | ORAL_CAPSULE | Freq: Two times a day (BID) | ORAL | Status: DC

## 2013-02-02 MED ORDER — OXYCODONE HCL 5 MG PO TABS: 5.0000 mg | ORAL_TABLET | ORAL | Status: AC | PRN

## 2013-02-02 MED ORDER — OXYCODONE HCL 5 MG PO TABS
10.0000 mg | ORAL_TABLET | Freq: Once | ORAL | Status: AC
  Administered 2013-02-02: 10 mg via ORAL

## 2013-02-02 MED ORDER — OXYCODONE-ACETAMINOPHEN 5-325 MG PO TABS: 1.0000 | ORAL_TABLET | ORAL | Status: DC | PRN

## 2013-02-02 MED ORDER — AMOXICILLIN-POT CLAVULANATE 875-125 MG PO TABS: 1.0000 | ORAL_TABLET | Freq: Three times a day (TID) | ORAL | Status: AC | PRN

## 2013-02-02 NOTE — Progress Notes (Signed)
1 Day Post-Op  Subjective: Pt having intermittent pain.  No f/c/s.    Objective: Vital signs in last 24 hours: Temp:  [98.2 F (36.8 C)-98.7 F (37.1 C)] 98.2 F (36.8 C) (01/06 0451) Pulse Rate:  [52-74] 52 (01/06 0451) Resp:  [16-24] 18 (01/06 0451) BP: (109-135)/(55-72) 121/72 mmHg (01/06 0451) SpO2:  [96 %-100 %] 100 % (01/06 0451) Weight:  [207 lb 3.7 oz (94 kg)] 207 lb 3.7 oz (94 kg) (01/05 1436) Last BM Date: 01/30/13  Intake/Output from previous day: 01/05 0701 - 01/06 0700 In: 2050 [I.V.:2050] Out: -  Intake/Output this shift:   PE General appearance: alert, cooperative and no distress Resp: clear to auscultation bilaterally Cardio: regular rate and rhythm, S1, S2 normal, no murmur, click, rub or gallop  Lab Results:   Recent Labs  01/31/13 2137 01/31/13 2149  WBC 13.4*  --   HGB 15.7 16.3  HCT 44.0 48.0  PLT 156  --    BMET  Recent Labs  01/31/13 2149  NA 138  K 3.8  CL 100  GLUCOSE 90  BUN 13  CREATININE 1.30    Anti-infectives: Anti-infectives   Start     Dose/Rate Route Frequency Ordered Stop   01/31/13 2145  Ampicillin-Sulbactam (UNASYN) 3 g in sodium chloride 0.9 % 100 mL IVPB     3 g 100 mL/hr over 60 Minutes Intravenous Every 6 hours 01/31/13 2131     01/31/13 2130  Ampicillin-Sulbactam (UNASYN) 3 g in sodium chloride 0.9 % 100 mL IVPB  Status:  Discontinued     3 g 100 mL/hr over 60 Minutes Intravenous  Once 01/31/13 2128 01/31/13 2131      Assessment/Plan: s/p Procedure(s): IRRIGATION AND DEBRIDEMENT PERIRECTAL ABSCESS (N/A)  POD #1 Pre-medicate with oxycodone now, change dressing, if he tolerates well discharge home.  Mother is at bedside who will aide with dressing changes.  CM consult, but doubt he is able to afford home health.    LOS: 2 days   Luis Luna ANP-BC 02/02/2013 7:57 AM

## 2013-02-02 NOTE — Progress Notes (Signed)
I have seen and examined the pt and agree with PA-Riebock's progress note. OK for DC as pt has good care at home and can help with dsg changes F/u with [facility]

## 2013-02-02 NOTE — Progress Notes (Signed)
PATIENT DISCHARGED TO HOME WITH INSTRUCTIONS GIVEN TO PT. AND MOTHER.

## 2013-02-02 NOTE — Discharge Instructions (Signed)

## 2013-02-02 NOTE — Care Management Note (Signed)
  Page 1 of 1   02/02/2013     10:54:05 AM   CARE MANAGEMENT NOTE 02/02/2013  Patient:  Elmore GuiseDARDEN,Demontay D   Account Number:  [id number]  Date Initiated:  02/02/2013  Documentation initiated by:  Ronny FlurryWILE,Abraham Margulies  Subjective/Objective Assessment:     Action/Plan:   Anticipated DC Date:  02/02/2013   Anticipated DC Plan:  HOME/SELF CARE  In-house referral  Financial Counselor      DC Planning Services  [facility]      Choice offered to / List presented to:             Status of service:   Medicare Important Message given?   (If response is "NO", the following Medicare IM given date fields will be blank) Date Medicare IM given:   Date Additional Medicare IM given:    Discharge Disposition:    Per UR Regulation:    If discussed at Long Length of Stay Meetings, dates discussed:    Comments:  02-02-13 Referral for medication assistance . [facility] . Patient only eligible for MATCH once a year.  Discharging on antibiotics . Over rode dates [facility] so patient can get antibiotic filled , pain medication will not be covered . Explained to patient he will not be eligible for [facility] again for another full calander year . Patient voiced understanding.   Gave patient information on [facility] , [facility] and [facility] . Encouragement he to call for appointment and see if he is eligible for orange card. Patient stated he will do the same.  Ronny FlurryHeather Tylin Force RN BSN [phone]

## 2013-02-02 NOTE — Discharge Summary (Signed)
Physician Discharge Summary  Luis GuiseKeith D Luna [id number] DOB: 02/24/1980 DOA: 01/31/2013  PCP: No PCP Per Patient  Consultation: none  Admit date: 01/31/2013 Discharge date: 02/02/2013  Recommendations for Outpatient Follow-up:   Follow-up Information   Follow up with [facility] On 02/16/2013. (arrive by 3:15 for a 3:45 appt.)    Contact information:   [address]   [place] [address] [phone]      Discharge Diagnoses:  1. Perirectal abscess    Surgical Procedure: I&D of perirectal abscess Dr. Derrell Lollingamirez 02/01/13   Discharge Condition: stable Disposition: home  Diet recommendation: regular  Filed Weights   01/31/13 1826 02/01/13 1436  Weight: 225 lb (102.059 kg) 207 lb 3.7 oz (94 kg)    Hospital Course:  Cleone SlimKeith Brand presented with 2 days of perirectal pain initially treated with hemorrhoid cream, but did improve.  His work up showed a peri-rectal abscess and underwent and I&D in the OR.  Post operatively, he was transferred to the floor, diet was advanced, pain was controlled.  On POD #1 his dressing was changed which he tolerated well with oral pain medication. Hs mother was present at time of the dressing change and verbalized understanding and demonstrated she will be able to do some every day at home.  he was kept on IV atbx and switched to PO which he will take for additional 7 days.  We discussed warning signs that warrant immediate attention.  We discussed side effects of pain medication.    Discharge Instructions   Future Appointments Provider Department Dept Phone   02/16/2013 3:45 PM [facility] [place] [phone]       Medication List         amoxicillin-clavulanate 875-125 MG per tablet  Commonly known as:  AUGMENTIN  Take 1 tablet by mouth 3 (three) times daily as needed.     DSS 100 MG Caps  Take 100 mg by mouth 2 (two) times daily.     oxyCODONE 5 MG immediate release tablet  Commonly  known as:  Oxy IR/ROXICODONE  Take 1-2 tablets (5-10 mg total) by mouth every 4 (four) hours as needed (pain).     oxyCODONE-acetaminophen 5-325 MG per tablet  Commonly known as:  ROXICET  Take 1 tablet by mouth every 4 (four) hours as needed for severe pain.           Follow-up Information   Follow up with [facility] On 02/16/2013. (arrive by 3:15 for a 3:45 appt.)    Contact information:   [address]   [place] [address] [phone]        The results of significant diagnostics from this hospitalization (including imaging, microbiology, ancillary and laboratory) are listed below for reference.    Significant Diagnostic Studies: No results found.  Microbiology: Recent Results (from the past 240 hour(s))  CULTURE, ROUTINE-ABSCESS     Status: None   Collection Time    02/01/13  4:27 PM      Result Value Range Status   Specimen Description ABSCESS PERIRECTAL   Final   Special Requests PT ON UNICEN   Final   Gram Stain     Final   Value: ABUNDANT WBC PRESENT, PREDOMINANTLY PMN     NO SQUAMOUS EPITHELIAL CELLS SEEN     ABUNDANT GRAM POSITIVE COCCI     IN PAIRS MODERATE GRAM NEGATIVE RODS  Performed at [facility] PENDING   Incomplete   Report Status PENDING   Incomplete  ANAEROBIC CULTURE     Status: None   Collection Time    02/01/13  4:37 PM      Result Value Range Status   Specimen Description ABSCESS PERIRECTAL   Final   Special Requests PT ON UNICEN   Final   Gram Stain     Final   Value: ABUNDANT WBC PRESENT, PREDOMINANTLY PMN     NO SQUAMOUS EPITHELIAL CELLS SEEN     ABUNDANT GRAM POSITIVE COCCI     IN PAIRS MODERATE GRAM NEGATIVE RODS     Performed at [facility]   Culture PENDING   Incomplete   Report Status PENDING   Incomplete     Labs: Basic Metabolic Panel:  Recent Labs Lab 01/31/13 2149  NA 138  K 3.8  CL 100  GLUCOSE 90  BUN 13  CREATININE 1.30   CBC:  Recent Labs Lab  01/31/13 2137 01/31/13 2149  WBC 13.4*  --   NEUTROABS 10.0*  --   HGB 15.7 16.3  HCT 44.0 48.0  MCV 89.8  --   PLT 156  --     Active Problems:   Perirectal abscess   Time coordinating discharge: <30 mins  Signed:  Albertine Lafoy, ANP-BC

## 2013-02-03 ENCOUNTER — Encounter (HOSPITAL_COMMUNITY): Payer: Self-pay | Admitting: General Surgery

## 2013-02-03 NOTE — ED Provider Notes (Signed)
Medical screening examination/treatment/procedure(s) were conducted as a shared visit with non-physician practitioner(s) and myself.  I personally evaluated the patient during the encounter.  EKG Interpretation    Date/Time:    Ventricular Rate:    PR Interval:    QRS Duration:   QT Interval:    QTC Calculation:   R Axis:     Text Interpretation:               Doug SouSam Jahkeem Kurka, MD 02/03/13 1410

## 2013-02-04 LAB — CULTURE, ROUTINE-ABSCESS

## 2013-02-05 ENCOUNTER — Ambulatory Visit (INDEPENDENT_AMBULATORY_CARE_PROVIDER_SITE_OTHER): Payer: Self-pay | Admitting: General Surgery

## 2013-02-05 ENCOUNTER — Encounter (INDEPENDENT_AMBULATORY_CARE_PROVIDER_SITE_OTHER): Payer: Self-pay | Admitting: General Surgery

## 2013-02-05 VITALS — BP 126/82 | HR 72 | Temp 98.4°F | Resp 14 | Ht >= 80 in | Wt 201.0 lb

## 2013-02-05 DIAGNOSIS — Z9889 Other specified postprocedural states: Secondary | ICD-10-CM

## 2013-02-05 NOTE — Progress Notes (Signed)
Patient ID: Luis Luna, male   DOB: 09/06/1980, 33 y.o.   MRN: [id number] Post op course The patient is status post I&D of a left gluteal abscess. Patient has been doing well postoperatively. His continue with dressing changes and continue with antibiotics.  On Exam: His wound is clean dry and intact, beefy-red, there is no purulent drainage, or surrounding erythema   Assessment and Plan 33 year old male status post I&D of a left gluteal abscess 1. Patient to continue covering the wound, I decided to pack it, 2. I discussed with them the shower several times a week.   Axel FillerArmando Isha Seefeld, MD [facility] General [facility]ally Invasive Surgery Trauma & Emergency Surgery

## 2013-02-06 LAB — ANAEROBIC CULTURE

## 2013-02-16 ENCOUNTER — Encounter (INDEPENDENT_AMBULATORY_CARE_PROVIDER_SITE_OTHER): Payer: Self-pay

## 2013-02-24 ENCOUNTER — Encounter (INDEPENDENT_AMBULATORY_CARE_PROVIDER_SITE_OTHER): Payer: Self-pay

## 2014-10-11 ENCOUNTER — Emergency Department (HOSPITAL_COMMUNITY)
Admission: EM | Admit: 2014-10-11 | Discharge: 2014-10-12 | Discharge status: Against Medical Advice | Payer: No Typology Code available for payment source | Source: Home / Self Care

## 2014-10-11 ENCOUNTER — Encounter (HOSPITAL_COMMUNITY): Payer: Self-pay

## 2014-10-11 DIAGNOSIS — Y998 Other external cause status: Secondary | ICD-10-CM

## 2014-10-11 DIAGNOSIS — S4991XA Unspecified injury of right shoulder and upper arm, initial encounter: Secondary | ICD-10-CM

## 2014-10-11 DIAGNOSIS — Y9389 Activity, other specified: Secondary | ICD-10-CM

## 2014-10-11 DIAGNOSIS — Z79899 Other long term (current) drug therapy: Secondary | ICD-10-CM | POA: Diagnosis not present

## 2014-10-11 DIAGNOSIS — Z720 Tobacco use: Secondary | ICD-10-CM | POA: Diagnosis not present

## 2014-10-11 DIAGNOSIS — Y92410 Unspecified street and highway as the place of occurrence of the external cause: Secondary | ICD-10-CM

## 2014-10-11 DIAGNOSIS — S199XXA Unspecified injury of neck, initial encounter: Secondary | ICD-10-CM | POA: Diagnosis not present

## 2014-10-11 MED ORDER — IBUPROFEN 400 MG PO TABS
600.0000 mg | ORAL_TABLET | Freq: Once | ORAL | Status: AC
  Administered 2014-10-11: 600 mg via ORAL
  Filled 2014-10-11 (×2): qty 1
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
## 2014-10-11 NOTE — ED Notes (Signed)
Called x's 3 without response 

## 2014-10-12 ENCOUNTER — Emergency Department (HOSPITAL_COMMUNITY)
Admission: EM | Admit: 2014-10-12 | Discharge: 2014-10-12 | Disposition: A | Discharge status: Home / Self Care | Payer: No Typology Code available for payment source | Attending: Emergency Medicine | Admitting: Emergency Medicine

## 2014-10-12 ENCOUNTER — Encounter (HOSPITAL_COMMUNITY): Payer: Self-pay

## 2014-10-12 DIAGNOSIS — Z720 Tobacco use: Secondary | ICD-10-CM | POA: Insufficient documentation

## 2014-10-12 DIAGNOSIS — S199XXA Unspecified injury of neck, initial encounter: Secondary | ICD-10-CM | POA: Insufficient documentation

## 2014-10-12 DIAGNOSIS — Y998 Other external cause status: Secondary | ICD-10-CM | POA: Insufficient documentation

## 2014-10-12 DIAGNOSIS — Y92410 Unspecified street and highway as the place of occurrence of the external cause: Secondary | ICD-10-CM | POA: Insufficient documentation

## 2014-10-12 DIAGNOSIS — Z79899 Other long term (current) drug therapy: Secondary | ICD-10-CM | POA: Insufficient documentation

## 2014-10-12 DIAGNOSIS — S4991XA Unspecified injury of right shoulder and upper arm, initial encounter: Secondary | ICD-10-CM | POA: Insufficient documentation

## 2014-10-12 DIAGNOSIS — Y9389 Activity, other specified: Secondary | ICD-10-CM | POA: Insufficient documentation

## 2014-10-12 DIAGNOSIS — M25511 Pain in right shoulder: Secondary | ICD-10-CM

## 2014-10-12 MED ORDER — NAPROXEN 250 MG PO TABS: 250.0000 mg | ORAL_TABLET | Freq: Two times a day (BID) | ORAL | Status: DC
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
## 2014-12-19 ENCOUNTER — Emergency Department (HOSPITAL_COMMUNITY)
Admission: EM | Admit: 2014-12-19 | Discharge: 2014-12-19 | Disposition: A | Discharge status: Home / Self Care | Payer: Self-pay | Attending: Emergency Medicine | Admitting: Emergency Medicine

## 2014-12-19 ENCOUNTER — Encounter (HOSPITAL_COMMUNITY): Payer: Self-pay | Admitting: *Deleted

## 2014-12-19 DIAGNOSIS — Z87828 Personal history of other (healed) physical injury and trauma: Secondary | ICD-10-CM | POA: Insufficient documentation

## 2014-12-19 DIAGNOSIS — S01112A Laceration without foreign body of left eyelid and periocular area, initial encounter: Secondary | ICD-10-CM | POA: Insufficient documentation

## 2014-12-19 DIAGNOSIS — Y999 Unspecified external cause status: Secondary | ICD-10-CM | POA: Insufficient documentation

## 2014-12-19 DIAGNOSIS — Y9367 Activity, basketball: Secondary | ICD-10-CM | POA: Insufficient documentation

## 2014-12-19 DIAGNOSIS — W500XXA Accidental hit or strike by another person, initial encounter: Secondary | ICD-10-CM | POA: Insufficient documentation

## 2014-12-19 DIAGNOSIS — Z791 Long term (current) use of non-steroidal anti-inflammatories (NSAID): Secondary | ICD-10-CM | POA: Insufficient documentation

## 2014-12-19 DIAGNOSIS — IMO0002 Reserved for concepts with insufficient information to code with codable children: Secondary | ICD-10-CM

## 2014-12-19 DIAGNOSIS — Z79899 Other long term (current) drug therapy: Secondary | ICD-10-CM | POA: Insufficient documentation

## 2014-12-19 DIAGNOSIS — Y92310 Basketball court as the place of occurrence of the external cause: Secondary | ICD-10-CM | POA: Insufficient documentation
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
## 2014-12-19 NOTE — ED Notes (Signed)
Pt arrives to ED after playing basketball and was elbowed in the left eye. Pt has open laceration below left eyebrow.

## 2014-12-19 NOTE — Discharge Instructions (Signed)
Avoid taking showers for the next 24 hours in order to allow the glue to harden. Complete the wound clean and dry. The glue will dissolve on its own. Do not pick at it. Follow-up with your doctor as needed for reevaluation. Return to ED for any new or worsening symptoms.

## 2015-04-13 ENCOUNTER — Encounter (HOSPITAL_COMMUNITY): Payer: Self-pay

## 2015-04-13 DIAGNOSIS — Y92410 Unspecified street and highway as the place of occurrence of the external cause: Secondary | ICD-10-CM | POA: Insufficient documentation

## 2015-04-13 DIAGNOSIS — F0781 Postconcussional syndrome: Secondary | ICD-10-CM | POA: Insufficient documentation

## 2015-04-13 DIAGNOSIS — S0990XA Unspecified injury of head, initial encounter: Secondary | ICD-10-CM | POA: Insufficient documentation

## 2015-04-13 DIAGNOSIS — S8992XA Unspecified injury of left lower leg, initial encounter: Secondary | ICD-10-CM | POA: Insufficient documentation

## 2015-04-13 DIAGNOSIS — Y998 Other external cause status: Secondary | ICD-10-CM | POA: Insufficient documentation

## 2015-04-13 DIAGNOSIS — Y9389 Activity, other specified: Secondary | ICD-10-CM | POA: Insufficient documentation

## 2015-04-13 DIAGNOSIS — F17210 Nicotine dependence, cigarettes, uncomplicated: Secondary | ICD-10-CM | POA: Insufficient documentation
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
## 2015-04-14 ENCOUNTER — Emergency Department (HOSPITAL_COMMUNITY)
Admission: EM | Admit: 2015-04-14 | Discharge: 2015-04-14 | Disposition: A | Discharge status: Home / Self Care | Payer: No Typology Code available for payment source | Attending: Emergency Medicine | Admitting: Emergency Medicine

## 2015-04-14 DIAGNOSIS — M25562 Pain in left knee: Secondary | ICD-10-CM

## 2015-04-14 DIAGNOSIS — R51 Headache: Secondary | ICD-10-CM

## 2015-04-14 DIAGNOSIS — F0781 Postconcussional syndrome: Secondary | ICD-10-CM

## 2015-04-14 DIAGNOSIS — R519 Headache, unspecified: Secondary | ICD-10-CM
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
## 2015-04-14 NOTE — ED Notes (Signed)
Pt continuously walking around waiting room not sitting down since being checked in, Pt in NAD or difficulty in gait.

## 2015-04-14 NOTE — ED Notes (Signed)
Pt departed in NAD.  

## 2016-10-08 ENCOUNTER — Emergency Department (HOSPITAL_COMMUNITY)
Admission: EM | Admit: 2016-10-08 | Discharge: 2016-10-10 | Disposition: A | Discharge status: Home / Self Care | Payer: Self-pay | Attending: Emergency Medicine | Admitting: Emergency Medicine

## 2016-10-08 ENCOUNTER — Emergency Department (HOSPITAL_COMMUNITY): Payer: Self-pay

## 2016-10-08 DIAGNOSIS — Y999 Unspecified external cause status: Secondary | ICD-10-CM | POA: Insufficient documentation

## 2016-10-08 DIAGNOSIS — Y929 Unspecified place or not applicable: Secondary | ICD-10-CM | POA: Insufficient documentation

## 2016-10-08 DIAGNOSIS — S71131A Puncture wound without foreign body, right thigh, initial encounter: Secondary | ICD-10-CM

## 2016-10-08 DIAGNOSIS — W3400XA Accidental discharge from unspecified firearms or gun, initial encounter: Secondary | ICD-10-CM | POA: Insufficient documentation

## 2016-10-08 DIAGNOSIS — S71101A Unspecified open wound, right thigh, initial encounter: Secondary | ICD-10-CM | POA: Insufficient documentation

## 2016-10-08 DIAGNOSIS — Y939 Activity, unspecified: Secondary | ICD-10-CM | POA: Insufficient documentation

## 2016-10-08 LAB — PREPARE FRESH FROZEN PLASMA
Unit division: 0
Unit division: 0

## 2016-10-08 LAB — BPAM FFP
BLOOD PRODUCT EXPIRATION DATE: 201809162359
BLOOD PRODUCT EXPIRATION DATE: 201809192359
ISSUE DATE / TIME: 201809112141
ISSUE DATE / TIME: 201809112141
UNIT TYPE AND RH: 6200
Unit Type and Rh: 6200

## 2016-10-08 LAB — BPAM RBC
Blood Product Expiration Date: 201809142359
Blood Product Expiration Date: 201809182359
ISSUE DATE / TIME: 201809112227
ISSUE DATE / TIME: 201809112227
UNIT TYPE AND RH: 9500
UNIT TYPE AND RH: 9500
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
## 2016-10-08 NOTE — Progress Notes (Signed)
Orthopedic Tech Progress Note Patient Details:  Luis Luna 05/21/1980 [id number]  Ortho Devices Type of Ortho Device: Crutches Ortho Device/Splint Interventions: Ordered, Adjustment   Luis Luna, Luis Luna 10/08/2016, 9:59 PM
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
## 2016-10-09 LAB — BLOOD PRODUCT ORDER (VERBAL) VERIFICATION

## 2016-10-09 MED ORDER — CEPHALEXIN 500 MG PO CAPS: 500.0000 mg | ORAL_CAPSULE | Freq: Four times a day (QID) | ORAL | 0 refills | Status: AC
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
